# Patient Record
Sex: Male | Born: 1960 | Race: White | Hispanic: No | Smoking: Never smoker
Health system: Southern US, Community
[De-identification: ages and names within clinical notes are randomized; demographics above are authoritative.]

## PROBLEM LIST (undated history)

## (undated) DIAGNOSIS — K5792 Diverticulitis of intestine, part unspecified, without perforation or abscess without bleeding: Secondary | ICD-10-CM

## (undated) DIAGNOSIS — G4700 Insomnia, unspecified: Secondary | ICD-10-CM

## (undated) DIAGNOSIS — K644 Residual hemorrhoidal skin tags: Secondary | ICD-10-CM

## (undated) DIAGNOSIS — Z800 Family history of malignant neoplasm of digestive organs: Secondary | ICD-10-CM

## (undated) DIAGNOSIS — K219 Gastro-esophageal reflux disease without esophagitis: Secondary | ICD-10-CM

## (undated) DIAGNOSIS — Z86010 Personal history of colonic polyps: Secondary | ICD-10-CM

## (undated) DIAGNOSIS — N3010 Interstitial cystitis (chronic) without hematuria: Secondary | ICD-10-CM

## (undated) HISTORY — DX: Insomnia, unspecified: G47.00

## (undated) HISTORY — DX: Interstitial cystitis (chronic) without hematuria: N30.10

## (undated) HISTORY — PX: CYSTOSCOPY: SUR368

## (undated) HISTORY — DX: Residual hemorrhoidal skin tags: K64.4

## (undated) HISTORY — DX: Personal history of colonic polyps: Z86.010

## (undated) HISTORY — DX: Gastro-esophageal reflux disease without esophagitis: K21.9

## (undated) HISTORY — DX: Diverticulitis of intestine, part unspecified, without perforation or abscess without bleeding: K57.92

## (undated) HISTORY — DX: Family history of malignant neoplasm of digestive organs: Z80.0

## (undated) HISTORY — PX: COLONOSCOPY: SHX174

---

## 1998-11-05 ENCOUNTER — Encounter (INDEPENDENT_AMBULATORY_CARE_PROVIDER_SITE_OTHER): Payer: Self-pay | Admitting: Specialist

## 1998-11-05 ENCOUNTER — Ambulatory Visit (HOSPITAL_COMMUNITY): Admission: RE | Admit: 1998-11-05 | Discharge: 1998-11-05 | Payer: Self-pay | Admitting: Urology

## 1999-05-19 ENCOUNTER — Encounter (INDEPENDENT_AMBULATORY_CARE_PROVIDER_SITE_OTHER): Payer: Self-pay

## 1999-05-19 ENCOUNTER — Other Ambulatory Visit: Admission: RE | Admit: 1999-05-19 | Discharge: 1999-05-19 | Payer: Self-pay | Admitting: Gastroenterology

## 2001-06-11 ENCOUNTER — Emergency Department (HOSPITAL_COMMUNITY): Admission: EM | Admit: 2001-06-11 | Discharge: 2001-06-11 | Payer: Self-pay | Admitting: Emergency Medicine

## 2001-06-11 ENCOUNTER — Encounter: Payer: Self-pay | Admitting: Emergency Medicine

## 2001-08-02 ENCOUNTER — Emergency Department (HOSPITAL_COMMUNITY): Admission: EM | Admit: 2001-08-02 | Discharge: 2001-08-02 | Payer: Self-pay | Admitting: *Deleted

## 2004-02-18 ENCOUNTER — Ambulatory Visit: Payer: Self-pay | Admitting: Internal Medicine

## 2004-07-01 ENCOUNTER — Ambulatory Visit: Payer: Self-pay | Admitting: Internal Medicine

## 2004-07-09 ENCOUNTER — Ambulatory Visit: Payer: Self-pay | Admitting: Internal Medicine

## 2004-07-17 ENCOUNTER — Ambulatory Visit: Payer: Self-pay | Admitting: Internal Medicine

## 2004-07-24 ENCOUNTER — Ambulatory Visit: Payer: Self-pay | Admitting: Internal Medicine

## 2004-08-10 ENCOUNTER — Ambulatory Visit: Payer: Self-pay | Admitting: Internal Medicine

## 2005-01-04 ENCOUNTER — Ambulatory Visit: Payer: Self-pay | Admitting: Internal Medicine

## 2005-01-19 ENCOUNTER — Ambulatory Visit: Payer: Self-pay | Admitting: Internal Medicine

## 2005-01-22 ENCOUNTER — Ambulatory Visit: Payer: Self-pay | Admitting: Cardiovascular Disease

## 2005-01-22 ENCOUNTER — Emergency Department (HOSPITAL_COMMUNITY): Admission: EM | Admit: 2005-01-22 | Discharge: 2005-01-22 | Payer: Self-pay | Admitting: Emergency Medicine

## 2005-02-16 ENCOUNTER — Ambulatory Visit: Payer: Self-pay | Admitting: Gastroenterology

## 2005-03-01 ENCOUNTER — Encounter: Payer: Self-pay | Admitting: Internal Medicine

## 2005-03-01 ENCOUNTER — Ambulatory Visit: Payer: Self-pay | Admitting: Gastroenterology

## 2005-03-01 DIAGNOSIS — Z86010 Personal history of colonic polyps: Secondary | ICD-10-CM

## 2005-03-01 HISTORY — DX: Personal history of colonic polyps: Z86.010

## 2005-06-14 ENCOUNTER — Ambulatory Visit: Payer: Self-pay | Admitting: Internal Medicine

## 2005-08-23 ENCOUNTER — Ambulatory Visit: Payer: Self-pay | Admitting: Internal Medicine

## 2006-01-04 ENCOUNTER — Ambulatory Visit: Payer: Self-pay | Admitting: Internal Medicine

## 2006-06-03 DIAGNOSIS — N3010 Interstitial cystitis (chronic) without hematuria: Secondary | ICD-10-CM

## 2006-10-04 ENCOUNTER — Telehealth (INDEPENDENT_AMBULATORY_CARE_PROVIDER_SITE_OTHER): Payer: Self-pay | Admitting: *Deleted

## 2006-11-07 ENCOUNTER — Ambulatory Visit: Payer: Self-pay | Admitting: Internal Medicine

## 2006-11-22 ENCOUNTER — Ambulatory Visit: Payer: Self-pay | Admitting: Internal Medicine

## 2006-11-28 ENCOUNTER — Encounter (INDEPENDENT_AMBULATORY_CARE_PROVIDER_SITE_OTHER): Payer: Self-pay | Admitting: *Deleted

## 2006-11-28 LAB — CONVERTED CEMR LAB
ALT: 17 units/L (ref 0–53)
Basophils Absolute: 0 10*3/uL (ref 0.0–0.1)
Calcium: 9.4 mg/dL (ref 8.4–10.5)
Chloride: 103 meq/L (ref 96–112)
Creatinine, Ser: 1.2 mg/dL (ref 0.4–1.5)
Eosinophils Relative: 1.3 % (ref 0.0–5.0)
HCT: 44 % (ref 39.0–52.0)
LDL Cholesterol: 125 mg/dL — ABNORMAL HIGH (ref 0–99)
MCHC: 33.6 g/dL (ref 30.0–36.0)
Neutrophils Relative %: 77.5 % — ABNORMAL HIGH (ref 43.0–77.0)
Platelets: 157 10*3/uL (ref 150–400)
RBC: 4.92 M/uL (ref 4.22–5.81)
RDW: 12.6 % (ref 11.5–14.6)
Sodium: 138 meq/L (ref 135–145)
TSH: 4.32 microintl units/mL (ref 0.35–5.50)
Total CHOL/HDL Ratio: 5.1
Triglycerides: 48 mg/dL (ref 0–149)
WBC: 8.4 10*3/uL (ref 4.5–10.5)

## 2007-02-27 ENCOUNTER — Ambulatory Visit: Payer: Self-pay | Admitting: Internal Medicine

## 2007-04-13 ENCOUNTER — Telehealth (INDEPENDENT_AMBULATORY_CARE_PROVIDER_SITE_OTHER): Payer: Self-pay | Admitting: *Deleted

## 2007-04-14 ENCOUNTER — Ambulatory Visit: Payer: Self-pay | Admitting: Internal Medicine

## 2007-05-01 ENCOUNTER — Telehealth (INDEPENDENT_AMBULATORY_CARE_PROVIDER_SITE_OTHER): Payer: Self-pay | Admitting: *Deleted

## 2007-05-01 LAB — CONVERTED CEMR LAB: Herpes Simplex Vrs I&II-IgM Ab (EIA): NOT DETECTED

## 2007-08-15 ENCOUNTER — Ambulatory Visit: Payer: Self-pay | Admitting: Internal Medicine

## 2007-09-16 ENCOUNTER — Ambulatory Visit: Payer: Self-pay | Admitting: Family Medicine

## 2007-09-16 DIAGNOSIS — R51 Headache: Secondary | ICD-10-CM

## 2007-09-16 DIAGNOSIS — R519 Headache, unspecified: Secondary | ICD-10-CM | POA: Insufficient documentation

## 2007-11-13 ENCOUNTER — Ambulatory Visit: Payer: Self-pay | Admitting: Internal Medicine

## 2007-11-13 DIAGNOSIS — D126 Benign neoplasm of colon, unspecified: Secondary | ICD-10-CM

## 2007-11-13 DIAGNOSIS — G4700 Insomnia, unspecified: Secondary | ICD-10-CM

## 2007-11-15 ENCOUNTER — Ambulatory Visit: Payer: Self-pay | Admitting: Internal Medicine

## 2007-11-20 LAB — CONVERTED CEMR LAB
ALT: 30 units/L (ref 0–53)
AST: 26 units/L (ref 0–37)
CO2: 29 meq/L (ref 19–32)
Chloride: 111 meq/L (ref 96–112)
Creatinine, Ser: 1.2 mg/dL (ref 0.4–1.5)
GFR calc non Af Amer: 70 mL/min
Hemoglobin: 14.5 g/dL (ref 13.0–17.0)
LDL Cholesterol: 140 mg/dL — ABNORMAL HIGH (ref 0–99)
Potassium: 4.4 meq/L (ref 3.5–5.1)
Sodium: 141 meq/L (ref 135–145)
TSH: 4.32 microintl units/mL (ref 0.35–5.50)
Total CHOL/HDL Ratio: 5.9
Triglycerides: 70 mg/dL (ref 0–149)

## 2007-12-05 ENCOUNTER — Telehealth: Payer: Self-pay | Admitting: Internal Medicine

## 2008-01-15 ENCOUNTER — Ambulatory Visit: Payer: Self-pay | Admitting: Family Medicine

## 2008-01-15 LAB — CONVERTED CEMR LAB: Inflenza A Ag: NEGATIVE

## 2008-01-20 ENCOUNTER — Emergency Department (HOSPITAL_COMMUNITY): Admission: EM | Admit: 2008-01-20 | Discharge: 2008-01-21 | Payer: Self-pay | Admitting: Emergency Medicine

## 2008-02-12 ENCOUNTER — Ambulatory Visit: Payer: Self-pay | Admitting: Gastroenterology

## 2008-02-26 ENCOUNTER — Encounter: Payer: Self-pay | Admitting: Gastroenterology

## 2008-02-26 ENCOUNTER — Ambulatory Visit: Payer: Self-pay | Admitting: Gastroenterology

## 2008-02-27 ENCOUNTER — Encounter: Payer: Self-pay | Admitting: Gastroenterology

## 2008-03-15 ENCOUNTER — Ambulatory Visit: Payer: Self-pay | Admitting: Internal Medicine

## 2008-03-15 ENCOUNTER — Telehealth: Payer: Self-pay | Admitting: Gastroenterology

## 2008-03-15 DIAGNOSIS — K5732 Diverticulitis of large intestine without perforation or abscess without bleeding: Secondary | ICD-10-CM

## 2008-03-18 ENCOUNTER — Ambulatory Visit: Payer: Self-pay | Admitting: Gastroenterology

## 2008-03-18 ENCOUNTER — Inpatient Hospital Stay (HOSPITAL_COMMUNITY): Admission: EM | Admit: 2008-03-18 | Discharge: 2008-03-21 | Payer: Self-pay | Admitting: Emergency Medicine

## 2008-04-02 ENCOUNTER — Telehealth: Payer: Self-pay | Admitting: Nurse Practitioner

## 2008-04-08 ENCOUNTER — Ambulatory Visit: Payer: Self-pay | Admitting: Gastroenterology

## 2008-04-12 ENCOUNTER — Telehealth: Payer: Self-pay | Admitting: Nurse Practitioner

## 2008-06-06 ENCOUNTER — Ambulatory Visit: Payer: Self-pay | Admitting: Family Medicine

## 2008-06-06 LAB — CONVERTED CEMR LAB
Nitrite: NEGATIVE
Protein, U semiquant: NEGATIVE
Specific Gravity, Urine: 1.015
Urobilinogen, UA: 0.2
pH: 7.5

## 2008-06-07 ENCOUNTER — Encounter: Payer: Self-pay | Admitting: Family Medicine

## 2008-06-07 ENCOUNTER — Telehealth: Payer: Self-pay | Admitting: Family Medicine

## 2008-06-07 LAB — CONVERTED CEMR LAB: Bacteria, UA: NONE SEEN

## 2008-06-08 ENCOUNTER — Encounter: Payer: Self-pay | Admitting: Family Medicine

## 2008-06-10 ENCOUNTER — Encounter (INDEPENDENT_AMBULATORY_CARE_PROVIDER_SITE_OTHER): Payer: Self-pay | Admitting: *Deleted

## 2008-06-18 ENCOUNTER — Encounter: Payer: Self-pay | Admitting: Internal Medicine

## 2008-06-20 ENCOUNTER — Encounter: Payer: Self-pay | Admitting: Internal Medicine

## 2008-10-24 ENCOUNTER — Telehealth: Payer: Self-pay | Admitting: Gastroenterology

## 2008-10-24 ENCOUNTER — Emergency Department (HOSPITAL_COMMUNITY): Admission: EM | Admit: 2008-10-24 | Discharge: 2008-10-25 | Payer: Self-pay | Admitting: Emergency Medicine

## 2008-10-29 ENCOUNTER — Telehealth: Payer: Self-pay | Admitting: Gastroenterology

## 2008-11-14 ENCOUNTER — Ambulatory Visit: Payer: Self-pay | Admitting: Gastroenterology

## 2008-12-18 ENCOUNTER — Encounter: Payer: Self-pay | Admitting: Gastroenterology

## 2009-03-26 ENCOUNTER — Ambulatory Visit: Payer: Self-pay | Admitting: Internal Medicine

## 2009-03-27 LAB — CONVERTED CEMR LAB
ALT: 26 units/L (ref 0–53)
AST: 23 units/L (ref 0–37)
BUN: 11 mg/dL (ref 6–23)
Basophils Relative: 0.2 % (ref 0.0–3.0)
CO2: 29 meq/L (ref 19–32)
Chloride: 106 meq/L (ref 96–112)
Creatinine, Ser: 1 mg/dL (ref 0.4–1.5)
Eosinophils Absolute: 0.1 10*3/uL (ref 0.0–0.7)
Eosinophils Relative: 3.4 % (ref 0.0–5.0)
HCT: 42.3 % (ref 39.0–52.0)
LDL Cholesterol: 111 mg/dL — ABNORMAL HIGH (ref 0–99)
Lymphs Abs: 0.8 10*3/uL (ref 0.7–4.0)
MCHC: 33.1 g/dL (ref 30.0–36.0)
MCV: 90.7 fL (ref 78.0–100.0)
Monocytes Absolute: 0.4 10*3/uL (ref 0.1–1.0)
Neutrophils Relative %: 63.9 % (ref 43.0–77.0)
PSA: 1.05 ng/mL (ref 0.10–4.00)
Platelets: 137 10*3/uL — ABNORMAL LOW (ref 150.0–400.0)
Potassium: 3.8 meq/L (ref 3.5–5.1)
RBC: 4.67 M/uL (ref 4.22–5.81)
VLDL: 16.2 mg/dL (ref 0.0–40.0)
WBC: 3.8 10*3/uL — ABNORMAL LOW (ref 4.5–10.5)

## 2009-04-02 ENCOUNTER — Ambulatory Visit: Payer: Self-pay | Admitting: Internal Medicine

## 2009-04-15 ENCOUNTER — Telehealth: Payer: Self-pay | Admitting: Internal Medicine

## 2009-06-23 ENCOUNTER — Ambulatory Visit: Payer: Self-pay | Admitting: Internal Medicine

## 2009-08-05 ENCOUNTER — Emergency Department (HOSPITAL_COMMUNITY): Admission: EM | Admit: 2009-08-05 | Discharge: 2009-08-06 | Payer: Self-pay | Admitting: Emergency Medicine

## 2009-08-22 ENCOUNTER — Ambulatory Visit: Payer: Self-pay | Admitting: Internal Medicine

## 2009-08-26 ENCOUNTER — Telehealth: Payer: Self-pay | Admitting: Internal Medicine

## 2009-09-04 ENCOUNTER — Ambulatory Visit: Payer: Self-pay | Admitting: Internal Medicine

## 2010-01-29 ENCOUNTER — Telehealth: Payer: Self-pay | Admitting: Gastroenterology

## 2010-02-24 ENCOUNTER — Ambulatory Visit
Admission: RE | Admit: 2010-02-24 | Discharge: 2010-02-24 | Payer: Self-pay | Source: Home / Self Care | Attending: Internal Medicine | Admitting: Internal Medicine

## 2010-02-24 DIAGNOSIS — K219 Gastro-esophageal reflux disease without esophagitis: Secondary | ICD-10-CM | POA: Insufficient documentation

## 2010-03-24 NOTE — Assessment & Plan Note (Signed)
Summary: congested/headache/cbs   Vital Signs:  Patient profile:   50 year old male Weight:      166.50 pounds Temp:     97.7 degrees F oral Pulse rate:   84 / minute Pulse rhythm:   regular BP sitting:   118 / 84  (left arm) Cuff size:   large  Vitals Entered By: Army Fossa CMA (September 04, 2009 8:25 AM) CC: c/o after finishing ATB symtoms returned. Comments - Having head congestion, HA, chest congestion.   History of Present Illness: was seen 2 weeks ago with a URI, as he was not feeling better he was Rx and took amoxicillin, he finished antibiotics 2 days ago and felt  great. Yesterday many of the symptoms came back --- headache, fatigue, chest congestion.  ROS No fever Some cough at present No sputum in the last couple of days No nausea vomiting Achy all over yesterday  Allergies: 1)  ! Demerol  Past History:  Past Medical History: Reviewed history from 04/02/2009 and no changes required. COLONIC POLYPS (ICD-211.3) INSOMNIA-SLEEP DISORDER-UNSPEC (ICD-780.52) Hx of INTERSTITIAL CYSTITIS   h/o diverticulitis   Past Surgical History: Reviewed history from 04/02/2009 and no changes required. Denies surgical history    Social History: Reviewed history from 04/02/2009 and no changes required. 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  Alcohol Use - no diet-- exercise--  runs 10 miles a week, wt lifting almost daily   Physical Exam  General:  alert, well-developed, and well-nourished.  nontoxic Head:  face symmetric, not tender to palpation Ears:  R ear normal and L ear normal.   Nose:  slightly congested Mouth:  no redness or discharge Lungs:  normal respiratory effort, no intercostal retractions, no accessory muscle use, and normal breath sounds.   Heart:  normal rate, regular rhythm, and no murmur.     Impression & Recommendations:  Problem # 1:  VIRAL INFECTION (ICD-079.99) URI type of  symptoms improved w/  amoxicillin Symptoms came back 2 days ago New virus? Atypical infection-bronchitis ? Plan----->  see instructions  Complete Medication List: 1)  Zithromax Z-pak 250 Mg Tabs (Azithromycin) .... As directed 2)  Prednisone 20 Mg Tabs (Prednisone) .... One by mouth daily for 5 days  Patient Instructions: 1)  rest, fluids 2)  Tylenol or Advil as needed for aches and pains or low grade temp 3)  Mucinex DM twice a day for one week 4)  Sudafed 30 mg behind the counter, one every 6 hours as needed for congestion 5)  if in 3 days you're not better,start a Z-Pak and a prednisone for 5 days 6)  Call anytime if you feel worse Prescriptions: PREDNISONE 20 MG TABS (PREDNISONE) one by mouth daily for 5 days  #5 x 0   Entered and Authorized by:   Nolon Rod. Cavion Faiola MD   Signed by:   Nolon Rod. Aliannah Holstrom MD on 09/04/2009   Method used:   Print then Give to Patient   RxID:   [id number] ZITHROMAX Z-PAK 250 MG TABS (AZITHROMYCIN) as directed  #1 x 0   Entered and Authorized by:   Nolon Rod. Fenris Cauble MD   Signed by:   Nolon Rod. Katherina Wimer MD on 09/04/2009   Method used:   Print then Give to Patient   RxID:   [id number]

## 2010-03-24 NOTE — Progress Notes (Signed)
Summary: Medication refill  Phone Note Call from Patient Call back at Home Phone [phone]   Caller: Patient Call For: Dr. Jarold Motto Reason for Call: Refill Medication Summary of Call: Needs a refill on his Hyoscyamine...[facility] [address] Initial call taken by: Karna Christmas,  January 29, 2010 8:47 AM  Follow-up for Phone Call        rx sent, pt aware Follow-up by: Harlow Mares CMA Duncan Dull),  January 29, 2010 8:56 AM    New/Updated Medications: LEVSIN 0.125 MG TABS (HYOSCYAMINE SULFATE) take one by mouth three times a day as needed Prescriptions: LEVSIN 0.125 MG TABS (HYOSCYAMINE SULFATE) take one by mouth three times a day as needed  #90 x 1   Entered by:   Harlow Mares CMA (AAMA)   Authorized by:   Mardella Layman MD [facility]   Signed by:   Harlow Mares CMA (AAMA) on 01/29/2010   Method used:   Electronically to        [facility] [phone]* (retail)       [address]       [place], [place]  [address]       Ph: [phone]       Fax: [phone]   RxID:   [id number]

## 2010-03-24 NOTE — Assessment & Plan Note (Signed)
Summary: dime size rash on leg//lch   Vital Signs:  Patient profile:   50 year old male Height:      68.25 inches Weight:      168.8 pounds Temp:     98.8 degrees F BP sitting:   100 / 60  Vitals Entered By: Shary Decamp (Jun 23, 2009 9:39 AM) CC: red area on rt leg x 3 days   History of Present Illness: -- red area on rt leg x 3 days --also reports he  self discontinued Ambien, does not like to take medication. he is sleeping  4 hours only but he wakes up refreshed and ready to go .  Wonders if that is a problem  Current Medications (verified): 1)  None  Allergies (verified): 1)  ! Demerol  Past History:  Past Medical History: Reviewed history from 04/02/2009 and no changes required. COLONIC POLYPS (ICD-211.3) INSOMNIA-SLEEP DISORDER-UNSPEC (ICD-780.52) Hx of INTERSTITIAL CYSTITIS   h/o diverticulitis   Past Surgical History: Reviewed history from 04/02/2009 and no changes required. Denies surgical history    Review of Systems       denies any injury to the leg, rash is not itching or hurting; no exposure to poison ivy no anxiety or depression at present  Physical Exam  General:  alert, well-developed, and well-nourished.   Extremities:  left leg normal right leg: Inner aspect of the mid pretibial area has a 2 inches area of mild swelling and ecchymoses, the midle  is very red.  No vesicles   Impression & Recommendations:  Problem # 1:  RASH-NONVESICULAR (ICD-782.1) likely a hematoma.  Recommend ice for few days, call if not back to normal in 3  weeks  Problem # 2:  INSOMNIA-SLEEP DISORDER-UNSPEC (ICD-780.52) if he sleeps 4 hours and wakes up refreshed and rested, I think is okay  but at the same time he should aim to 6 hours of sleep every night. printed material provided regards good sleeping habits  The following medications were removed from the medication list:    Zolpidem Tartrate 10 Mg Tabs (Zolpidem tartrate) .Marland Kitchen... 1/2 by mouth qhs

## 2010-03-24 NOTE — Assessment & Plan Note (Signed)
Summary: cpx//ccm   Vital Signs:  Patient profile:   50 year old male Height:      68.25 inches Weight:      169 pounds BMI:     25.60 Pulse rate:   60 / minute BP sitting:   100 / 60  Vitals Entered By: Shary Decamp (April 02, 2009 2:04 PM)   History of Present Illness: CPX  Allergies: 1)  ! Demerol  Past History:  Past Medical History: COLONIC POLYPS (ICD-211.3) INSOMNIA-SLEEP DISORDER-UNSPEC (ICD-780.52) Hx of INTERSTITIAL CYSTITIS   h/o diverticulitis   Past Surgical History: Denies surgical history    Family History: Reviewed history from 03/15/2008 and no changes required. father is doing well hypertension--HTN diabetes--no heart attacks--no. 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  Alcohol Use - no diet-- exercise--  runs 10 miles a week, wt lifting almost daily   Review of Systems General:  Denies fever and weight loss. CV:  Denies chest pain or discomfort, palpitations, and swelling of feet. Resp:  Denies cough, coughing up blood, and wheezing. GU:  Denies hematuria, urinary frequency, and urinary hesitancy. Psych:  Denies anxiety and depression; sleeps aprox 5 h at night , has 2 jobs .  Physical Exam  General:  alert, well-developed, and well-nourished.   Neck:  no masses, no thyromegaly, no HJR, and normal carotid upstroke.   Lungs:  normal respiratory effort, no intercostal retractions, no accessory muscle use, and normal breath sounds.   Heart:  normal rate, regular rhythm, no murmur, and no gallop.   Abdomen:  soft, non-tender, no distention, no masses, no guarding, and no rigidity.   Extremities:  no pretibial edema bilaterally  Psych:  Cognition and judgment appear intact. Alert and cooperative with normal attention span and concentration    Impression &  Recommendations:  Problem # 1:  HEALTH SCREENING (ICD-V70.0) T 08 got a flu shot  next colonoscopy per GI rec.  to continue with his healthy lifestyle . multiple questions about screening for cancer  answered all labs  reviewed, mild thrombocytopenia, repat CBCs yearly   Complete Medication List: 1)  Levsin 0.125 Mg Tabs (Hyoscyamine sulfate) .... Take as needed  Patient Instructions: 1)  Please schedule a follow-up appointment in 1 year.    Preventive Care Screening  Prior Values:    PSA:  1.05 (03/26/2009)    Colonoscopy:  Location:  [facility].   (02/26/2008)    Last Tetanus Booster:  Tdap (11/07/2006)    Risk Factors: Tobacco use:  never Alcohol use:  no Exercise:  yes    Times per week:  7    Type:  RUNNING,WT LIFTING  Colonoscopy History:    Date of Last Colonoscopy:  02/26/2008

## 2010-03-24 NOTE — Progress Notes (Signed)
Summary: ?xanax  Phone Note Call from Patient Call back at [facility] Phone [phone]   Summary of Call: Patient having trouble sleeping.  Working 2 jobs & goes to bed @ 1am, gets up @ 7am.. states he is a light sleeper so sometimes he wakes up around 5am.  Patient has some xanax 0.25 from a neighbor.  Worked well.  requesting rx.  patient states that he has taken [place] before but made him feel "drugged" the next day. RA Sharin Mons RD Shary Decamp  April 15, 2009 2:04 PM   Follow-up for Phone Call        needs to try ambien 10mg  1/2 tab at night #30, no RF Mandie Crabbe E. Orvil Faraone MD  April 15, 2009 5:06 PM   discussed with pt Shary Decamp  April 16, 2009 9:04 AM     New/Updated Medications: ZOLPIDEM TARTRATE 10 MG TABS (ZOLPIDEM TARTRATE) 1/2 by mouth qhs Prescriptions: ZOLPIDEM TARTRATE 10 MG TABS (ZOLPIDEM TARTRATE) 1/2 by mouth qhs  #30 x 0   Entered by:   Shary Decamp   Authorized by:   Nolon Rod. Asheton Scheffler MD   Signed by:   Shary Decamp on 04/16/2009   Method used:   Printed then faxed to ...       [facility]  [address] [phone]* (retail)       [address]       [place], [place]  [address]       Ph: [phone]       Fax: [phone]   RxID:   [id number]

## 2010-03-24 NOTE — Progress Notes (Signed)
Summary: Cold Symptoms  Phone Note Call from Patient Call back at Home Phone [phone]   Caller: Patient Reason for Call: Talk to Doctor Summary of Call: Patient called and left a message on the triage line stating Dr. Drue Novel told him to call back on tuesday if he was not feeling any better. He said he was still having bad cold symptoms.  Please advise.  Initial call taken by: Harold Barban,  August 26, 2009 9:16 AM  Follow-up for Phone Call        call amoxicillin 500 mg 2 by mouth two times a day x 1 week #28, no Rf Angello Chien E. Laysa Kimmey MD  August 26, 2009 12:55 PM   Additional Follow-up for Phone Call Additional follow up Details #1::        Pt is aware. Army Fossa CMA  August 26, 2009 1:32 PM     New/Updated Medications: AMOXICILLIN 500 MG CAPS (AMOXICILLIN) 2 by mouth two times a day x 1 week Prescriptions: AMOXICILLIN 500 MG CAPS (AMOXICILLIN) 2 by mouth two times a day x 1 week  #28 x 0   Entered by:   Army Fossa CMA   Authorized by:   Nolon Rod. Yeily Link MD   Signed by:   Army Fossa CMA on 08/26/2009   Method used:   Electronically to        [facility] [phone]* (retail)       [address]       [place], [place]  [address]       Ph: [phone]       Fax: [phone]   RxID:   [id number]

## 2010-03-24 NOTE — Assessment & Plan Note (Signed)
Summary: congested/sore throat//kn   Vital Signs:  Patient profile:   50 year old male Weight:      169.13 pounds Temp:     97.0 degrees F oral Pulse rate:   80 / minute Pulse rhythm:   regular BP sitting:   124 / 80  (left arm) Cuff size:   large  Vitals Entered By: Army Fossa CMA (August 22, 2009 11:09 AM) CC: c/o head cold Comments - Drainage in throat Minor cough x 2 days    History of Present Illness: sore throat for 2 days, then developed chest congestion, some cough, coughing clear sputum  ROS no sick contacts  (-) fever (-) N-V-D mild myalgias all over    Allergies: 1)  ! Demerol  Past History:  Past Medical History: Reviewed history from 04/02/2009 and no changes required. COLONIC POLYPS (ICD-211.3) INSOMNIA-SLEEP DISORDER-UNSPEC (ICD-780.52) Hx of INTERSTITIAL CYSTITIS   h/o diverticulitis   Past Surgical History: Reviewed history from 04/02/2009 and no changes required. Denies surgical history    Social History: Reviewed history from 04/02/2009 and no changes required. 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  Alcohol Use - no diet-- exercise--  runs 10 miles a week, wt lifting almost daily   Review of Systems      See HPI  Physical Exam  General:  alert, well-developed, and well-nourished.   Ears:  R ear normal and L ear normal.   Mouth:  no redness, no discharge Neck:  no masses and no cervical lymphadenopathy.   Lungs:  normal respiratory effort, no intercostal retractions, no accessory muscle use, and normal breath sounds.   Heart:  normal rate, regular rhythm, no murmur, and no gallop.     Impression & Recommendations:  Problem # 1:  URI (ICD-465.9) Assessment New see instructions  Patient Instructions: 1)  rest, fluids, Tylenol or Advil if you develop fever 2)  Robitussin-DM over-the-counter as needed for cough 3)  Call in 3 to 4 days if not better 4)  Call  anytime if you feel worse or you have high fever

## 2010-03-26 NOTE — Assessment & Plan Note (Signed)
Summary: DISCUSS SEVERE HEARTBURN CAUSED BY MEDICATION FROM UROLOGIST/...   Vital Signs:  Patient profile:   50 year old male Height:      68.25 inches Weight:      167.38 pounds BMI:     25.36 Pulse rate:   80 / minute Pulse rhythm:   regular BP sitting:   128 / 86  (left arm) Cuff size:   large  Vitals Entered By: Army Fossa CMA (February 24, 2010 10:15 AM) CC: Pt here to discuss heartburn x 1 month Comments Only took 2 pills of new med urologist put him on- gave him Amitriptyline [facility] [address]     History of Present Illness: a month ago, he tried amitriptyline x 2 days as prescribed as his urologist for overactive bladder. After the first few, he developed "indigestion", "chest pain"; after the second dose he decided to stop amitriptyline.  The indigestion is described as an  epigastric rumbling and discomfort, he had pain in the anterior chest described as "soreness" with no radiation or associated symptoms except belching. He went running while he was having all these  sx and felt normal. he is taking a Nexium daily from a friend, it completely relieves the symptoms. He forgot to take it one day and have mild symptoms.  Review of systems Denies nausea or vomiting No dysphagia or odynophagia No shortness of breath or lower extremity edema  Current Medications (verified): 1)  Levsin 0.125 Mg Tabs (Hyoscyamine Sulfate) .... Take One By Mouth Three Times A Day As Needed  Allergies (verified): 1)  ! Demerol  Past History:  Past Medical History: COLONIC POLYPS (ICD-211.3) INSOMNIA-SLEEP DISORDER-UNSPEC (ICD-780.52) Hx of INTERSTITIAL CYSTITIS   h/o diverticulitis  GERD  Social History: Reviewed history from 04/02/2009 and no changes required. 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  Alcohol Use - no diet-- exercise--  runs 10 miles a week, wt lifting almost daily   Physical Exam  General:   alert, well-developed, and well-nourished.   Chest Wall:  no deformities and no tenderness.   Lungs:  normal respiratory effort, no intercostal retractions, no accessory muscle use, and normal breath sounds.   Heart:  normal rate, regular rhythm, and no murmur.   Abdomen:  soft, non-tender, no distention, no masses, no guarding, and no rigidity.     Impression & Recommendations:  Problem # 1:  GERD (ICD-530.81) developed GI symptoms and chest pain shortly after 2 doses of amitriptyline. Symptoms still persist despite the fact that he has not taken amitriptyline in almost a month. I doubt he has a cardiac problem, likely symptoms are related to GERD Plan: see instructions  His updated medication list for this problem includes:    Levsin 0.125 Mg Tabs (Hyoscyamine sulfate) .Marland Kitchen... Take one by mouth three times a day as needed    Nexium 40 Mg Cpdr (Esomeprazole magnesium) .Marland Kitchen... 1 by mouth once daily on a empty stomach  Complete Medication List: 1)  Levsin 0.125 Mg Tabs (Hyoscyamine sulfate) .... Take one by mouth three times a day as needed 2)  Nexium 40 Mg Cpdr (Esomeprazole magnesium) .Marland Kitchen.. 1 by mouth once daily on a empty stomach  Patient Instructions: 1)  read the information about GERD 2)  nexium x 6 weeks daily, then as needed  3)  if symptoms severe or persist: let me know  Prescriptions: NEXIUM 40 MG CPDR (ESOMEPRAZOLE MAGNESIUM) 1 by mouth once daily on a empty  stomach  #90 x 1   Entered and Authorized by:   Nolon Rod. Patsy Zaragoza MD   Signed by:   Nolon Rod. Yandel Zeiner MD on 02/24/2010   Method used:   Print then Give to Patient   RxID:   [id number]    Orders Added: 1)  Est. Patient Level III [14782]

## 2010-04-02 ENCOUNTER — Encounter: Payer: Self-pay | Admitting: Internal Medicine

## 2010-04-02 ENCOUNTER — Encounter (INDEPENDENT_AMBULATORY_CARE_PROVIDER_SITE_OTHER): Payer: BC Managed Care – PPO | Admitting: Internal Medicine

## 2010-04-02 ENCOUNTER — Other Ambulatory Visit: Payer: Self-pay | Admitting: Internal Medicine

## 2010-04-02 DIAGNOSIS — Z0000 Encounter for general adult medical examination without abnormal findings: Secondary | ICD-10-CM

## 2010-04-02 DIAGNOSIS — K219 Gastro-esophageal reflux disease without esophagitis: Secondary | ICD-10-CM

## 2010-04-02 DIAGNOSIS — K5732 Diverticulitis of large intestine without perforation or abscess without bleeding: Secondary | ICD-10-CM

## 2010-04-02 LAB — HEPATIC FUNCTION PANEL
Albumin: 4.1 g/dL (ref 3.5–5.2)
Alkaline Phosphatase: 52 U/L (ref 39–117)
Total Protein: 7 g/dL (ref 6.0–8.3)

## 2010-04-02 LAB — CBC WITH DIFFERENTIAL/PLATELET
Eosinophils Relative: 2.7 % (ref 0.0–5.0)
Lymphocytes Relative: 15.5 % (ref 12.0–46.0)
Monocytes Absolute: 0.5 10*3/uL (ref 0.1–1.0)
Monocytes Relative: 11.1 % (ref 3.0–12.0)
Neutrophils Relative %: 70.4 % (ref 43.0–77.0)
Platelets: 117 10*3/uL — ABNORMAL LOW (ref 150.0–400.0)
RBC: 4.67 Mil/uL (ref 4.22–5.81)
WBC: 4.7 10*3/uL (ref 4.5–10.5)

## 2010-04-02 LAB — LIPID PANEL
Cholesterol: 182 mg/dL (ref 0–200)
HDL: 32.8 mg/dL — ABNORMAL LOW (ref >39.00)
LDL Cholesterol: 124 mg/dL — ABNORMAL HIGH (ref 0–99)
Triglycerides: 126 mg/dL (ref 0.0–149.0)
VLDL: 25.2 mg/dL (ref 0.0–40.0)

## 2010-04-02 LAB — BASIC METABOLIC PANEL
BUN: 17 mg/dL (ref 6–23)
Calcium: 8.8 mg/dL (ref 8.4–10.5)
Creatinine, Ser: 1.1 mg/dL (ref 0.4–1.5)
GFR: 78.37 mL/min (ref >60.00)

## 2010-04-02 LAB — CONVERTED CEMR LAB
Chlamydia, Swab/Urine, PCR: NEGATIVE
HIV: NONREACTIVE

## 2010-04-09 NOTE — Assessment & Plan Note (Signed)
Summary: YEARLY//PH   Vital Signs:  Patient profile:   50 year old male Height:      68.25 inches Weight:      168.25 pounds Pulse rate:   73 / minute Pulse rhythm:   regular BP sitting:   130 / 76  (left arm) Cuff size:   large  Vitals Entered By: Army Fossa CMA (April 02, 2010 8:08 AM) CC: CPX, fasting  Comments no complaints  wants to be checked for STD's today [facility] [address]    History of Present Illness: CPX likes "all  STDs checked" Denies high-risk sexual intercourse, no symptoms  Current Medications (verified): 1)  None  Allergies (verified): 1)  ! Demerol  Past History:  Past Medical History: COLONIC POLYPS   INSOMNIA-SLEEP DISORDER-UNSPEC  Hx of INTERSTITIAL CYSTITIS   h/o diverticulitis  GERD  Past Surgical History: Reviewed history from 04/02/2009 and no changes required. Denies surgical history    Family History: father is doing well, 64 y/o hypertension-- M diabetes--no heart attacks--no. 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  Alcohol Use - no diet-- exercise--  runs 10 miles a week, wt lifting almost daily   Review of Systems CV:  Denies chest pain or discomfort and swelling of feet. Resp:  Denies cough, shortness of breath, and sputum productive. GI:  Denies bloody stools, diarrhea, nausea, and vomiting; had GERD , symptoms subsided after meds, currently on no meds . GU:  continue with mild urinary symptoms from interstitial cystitis. Specifically he denies penile discharge, genital rash.Marland Kitchen Psych:  Denies anxiety and depression.  Physical Exam  General:  alert, well-developed, and well-nourished.   Neck:  no masses and no thyromegaly.   Lungs:  normal respiratory effort, no intercostal retractions, no accessory muscle use, and normal breath  sounds.   Heart:  normal rate, regular rhythm, and no murmur.   Abdomen:  soft, non-tender, no distention, no masses, no guarding, and no rigidity.   Extremities:  no pretibial edema bilaterally  Psych:  Cognition and judgment appear intact. Alert and cooperative with normal attention span and concentration. not anxious appearing and not depressed appearing.     Impression & Recommendations:  Problem # 1:  HEALTH SCREENING (ICD-V70.0)  Td  08 declines a flu shot   last colonoscopy 2010, polyp, next in 5 years  rec.  to continue with his healthy lifestyl  in addition to his regular blood work will do an HIV, VDRL and G&C per patient request. EKG @ baseline  Orders: Venipuncture (98119) TLB-BMP (Basic Metabolic Panel-BMET) (80048-METABOL) TLB-CBC Platelet - w/Differential (85025-CBCD) TLB-Lipid Panel (80061-LIPID) TLB-Hepatic/Liver Function Pnl (80076-HEPATIC) T-HIV Antibody  (Reflex) ([id number]) T-RPR (Syphilis) (30865-78469) Specimen Handling (62952) EKG w/ Interpretation (93000)  Patient Instructions: 1)  Please schedule a follow-up appointment in 1 year.    Orders Added: 1)  Venipuncture [36415] 2)  TLB-BMP (Basic Metabolic Panel-BMET) [80048-METABOL] 3)  TLB-CBC Platelet - w/Differential [85025-CBCD] 4)  TLB-Lipid Panel [80061-LIPID] 5)  TLB-Hepatic/Liver Function Pnl [80076-HEPATIC] 6)  T-HIV Antibody  (Reflex) [[id number]] 7)  T-RPR (Syphilis) [27253-66440] 8)  Specimen Handling [99000] 9)  EKG w/ Interpretation [93000] 10)  Est. Patient age 15-64 [phone]

## 2010-05-11 LAB — BASIC METABOLIC PANEL
GFR calc non Af Amer: >60 mL/min (ref >60)
Glucose, Bld: 106 mg/dL — ABNORMAL HIGH (ref 70–99)
Potassium: 3.7 mEq/L (ref 3.5–5.1)
Sodium: 137 mEq/L (ref 135–145)

## 2010-05-11 LAB — DIFFERENTIAL
Eosinophils Relative: 5 % (ref 0–5)
Lymphocytes Relative: 23 % (ref 12–46)
Lymphs Abs: 1.1 10*3/uL (ref 0.7–4.0)
Monocytes Absolute: 0.7 10*3/uL (ref 0.1–1.0)

## 2010-05-11 LAB — CBC
HCT: 42.2 % (ref 39.0–52.0)
Hemoglobin: 14.1 g/dL (ref 13.0–17.0)
WBC: 4.8 10*3/uL (ref 4.0–10.5)

## 2010-05-11 LAB — POCT CARDIAC MARKERS: CKMB, poc: <1 ng/mL — ABNORMAL LOW (ref 1.0–8.0)

## 2010-05-29 LAB — COMPREHENSIVE METABOLIC PANEL
ALT: 32 U/L (ref 0–53)
AST: 29 U/L (ref 0–37)
Albumin: 3.8 g/dL (ref 3.5–5.2)
Alkaline Phosphatase: 57 U/L (ref 39–117)
BUN: 14 mg/dL (ref 6–23)
CO2: 25 mEq/L (ref 19–32)
Calcium: 8.7 mg/dL (ref 8.4–10.5)
Chloride: 107 mEq/L (ref 96–112)
Creatinine, Ser: 0.9 mg/dL (ref 0.4–1.5)
GFR calc Af Amer: >60 mL/min (ref >60)
GFR calc non Af Amer: >60 mL/min (ref >60)
Glucose, Bld: 103 mg/dL — ABNORMAL HIGH (ref 70–99)
Potassium: 3.6 mEq/L (ref 3.5–5.1)
Sodium: 137 mEq/L (ref 135–145)
Total Bilirubin: 0.5 mg/dL (ref 0.3–1.2)
Total Protein: 7 g/dL (ref 6.0–8.3)

## 2010-05-29 LAB — URINALYSIS, ROUTINE W REFLEX MICROSCOPIC
Glucose, UA: NEGATIVE mg/dL
Specific Gravity, Urine: 1.008 (ref 1.005–1.030)
pH: 6.5 (ref 5.0–8.0)

## 2010-05-29 LAB — CBC
HCT: 41.4 % (ref 39.0–52.0)
Hemoglobin: 13.9 g/dL (ref 13.0–17.0)
Platelets: 136 10*3/uL — ABNORMAL LOW (ref 150–400)
WBC: 10.3 10*3/uL (ref 4.0–10.5)

## 2010-05-29 LAB — DIFFERENTIAL
Eosinophils Absolute: 0.1 10*3/uL (ref 0.0–0.7)
Eosinophils Relative: 1 % (ref 0–5)
Lymphs Abs: 0.9 10*3/uL (ref 0.7–4.0)
Monocytes Relative: 12 % (ref 3–12)

## 2010-06-08 LAB — CBC
HCT: 37.1 % — ABNORMAL LOW (ref 39.0–52.0)
HCT: 42.4 % (ref 39.0–52.0)
Hemoglobin: 12.6 g/dL — ABNORMAL LOW (ref 13.0–17.0)
Hemoglobin: 14.1 g/dL (ref 13.0–17.0)
MCHC: 34.1 g/dL (ref 30.0–36.0)
MCV: 90.5 fL (ref 78.0–100.0)
Platelets: 153 10*3/uL (ref 150–400)
RBC: 4.16 MIL/uL — ABNORMAL LOW (ref 4.22–5.81)
RDW: 13.1 % (ref 11.5–15.5)
RDW: 13.3 % (ref 11.5–15.5)
WBC: 11.2 10*3/uL — ABNORMAL HIGH (ref 4.0–10.5)

## 2010-06-08 LAB — DIFFERENTIAL
Basophils Absolute: 0 10*3/uL (ref 0.0–0.1)
Basophils Relative: 0 % (ref 0–1)
Lymphocytes Relative: 5 % — ABNORMAL LOW (ref 12–46)
Monocytes Absolute: 1.1 10*3/uL — ABNORMAL HIGH (ref 0.1–1.0)
Neutro Abs: 9.5 10*3/uL — ABNORMAL HIGH (ref 1.7–7.7)
Neutrophils Relative %: 85 % — ABNORMAL HIGH (ref 43–77)

## 2010-06-08 LAB — BASIC METABOLIC PANEL
CO2: 27 mEq/L (ref 19–32)
Chloride: 105 mEq/L (ref 96–112)
GFR calc Af Amer: >60 mL/min (ref >60)
Glucose, Bld: 81 mg/dL (ref 70–99)
Potassium: 4.2 mEq/L (ref 3.5–5.1)
Sodium: 138 mEq/L (ref 135–145)

## 2010-06-08 LAB — COMPREHENSIVE METABOLIC PANEL
Albumin: 3.7 g/dL (ref 3.5–5.2)
BUN: 12 mg/dL (ref 6–23)
Chloride: 101 mEq/L (ref 96–112)
Creatinine, Ser: 0.88 mg/dL (ref 0.4–1.5)
GFR calc non Af Amer: >60 mL/min (ref >60)
Glucose, Bld: 100 mg/dL — ABNORMAL HIGH (ref 70–99)
Total Bilirubin: 0.7 mg/dL (ref 0.3–1.2)

## 2010-06-08 LAB — CULTURE, BLOOD (ROUTINE X 2): Culture: NO GROWTH

## 2010-06-08 LAB — URINALYSIS, ROUTINE W REFLEX MICROSCOPIC
Glucose, UA: NEGATIVE mg/dL
Hgb urine dipstick: NEGATIVE
Ketones, ur: NEGATIVE mg/dL
Protein, ur: NEGATIVE mg/dL

## 2010-06-08 LAB — LIPASE, BLOOD: Lipase: 32 U/L (ref 11–59)

## 2010-07-07 NOTE — H&P (Signed)
NAMEMARCO, Timothy Medina NO.:  [id number]   MEDICAL RECORD NO.:  [id number]          PATIENT TYPE:  INP   LOCATION:  0106                         FACILITY:  [facility]   PHYSICIAN:  Barbette Hair. Arlyce Dice, MD,FACGDATE OF BIRTH:  [birth year]1962   DATE OF ADMISSION:  03/18/2008  DATE OF DISCHARGE:                              HISTORY & PHYSICAL   PRIMARY GASTROENTEROLOGIST:  Vania Rea. Jarold Motto, MD, Clementeen Graham, FACP, FAGA.   PRIMARY PHYSICIAN:  Willow Ora, MD.   HISTORY OF PRESENT ILLNESS:  Timothy Medina is a 50 year old male with a  history of recurrent diverticulitis in 2006 and again in November 2009.  The patient was seen in our office Friday with abdominal pain similar to  diverticular pain.  He was restarted on Cipro and Flagyl.  The patient  had been in the emergency department the end of November for  diverticulitis.  CAT scan at that time was consistent with sigmoid  diverticulitis.  The patient was given Cipro and Flagyl and took all but  just a couple of doses.  The patient was fine for several weeks until a  few days ago his left lower quadrant abdominal pain returned and he came  into our office to be seen.  At the time of our office visit the patient  had already started himself on Cipro and Flagyl.  He was not having any  nausea or vomiting.  As a result of the office visit the patient was  continued on both Cipro and Flagyl as well as given Darvocet for pain.  Friday night and most of Saturday afternoon the patient fell okay.  Saturday afternoon, however, the patient began to feel poorly.  He was  excessively tired and febrile.  Despite taking Motrin for fever, the  patient still had a low grade temperature.  This morning Timothy Medina  began having nausea and vomiting and therefore came to the emergency  department as he was unable to hold down any of his oral medications.  In the emergency department Timothy Medina is afebrile.  He has a mildly  elevated white count of 11.2.  CT of  the abdomen and pelvis with IV  contrast is compatible with sigmoid diverticulitis and questionable  early abscess formation.  The patient has required two doses of morphine  since being in the emergency department.  He has had no further nausea  or vomiting with a dose of Zofran.  He is receiving IV fluids.  Vital  signs are stable.  The patient is hyponatremic with a sodium of 131.  Potassium is normal.  Renal function is within normal limits.  Urinalysis is unremarkable.   PAST MEDICAL HISTORY:  Adenomatous polyps.   SOCIAL HISTORY:  Divorced.  The patient is a Runner, broadcasting/film/video.  Nonsmoker.  No  alcohol abuse history.   FAMILY MEDICAL HISTORY:  Colorectal cancer in cousin and uncle, insomnia  and diverticulitis, Alzheimer's, hypertension.   HOME MEDICATION:  1. Cipro 250 mg b.i.d.  2. Flagyl 500 mg 3 times daily.  3. Darvocet N 100 every 4 hours p.r.n.   ALLERGIES:  No known drug  allergies.   REVIEW OF SYSTEMS:  Positive for fevers and fatigue.  All other systems  reviewed and negative other than were noted in the HPI.   PHYSICAL ASSESSMENT:  Temperature 98.2, heart rate 64, respirations 18,  blood pressure 110/56.  Timothy Medina is a pleasant 50 year old male lying  in bed in no acute distress.  HEENT: No scleral icterus.  Conjunctivae pink.  NECK:  Supple.  No palpable Lymphadenopathy.  CARDIAC:  Regular rate and rhythm.  No appreciable murmurs.  RESPIRATORY:  Bilateral lungs are clear.  GI:  Hypoactive bowel sounds.  The abdomen is soft, nondistended.  There  is mild left lower quadrant tenderness to palpation.  No obvious  palpable masses.  EXTREMITIES:  No lower extremity edema.  NEUROLOGIC:  Alert and oriented.  PSYCHOLOGICAL:  Cooperative.   LABORATORY STUDIES:  WBC 11.2, hemoglobin 14.1, hematocrit 42.4, MCV  90.5, platelets 153.  Sodium 131, potassium 4.2, BUN 12, creatinine  0.88, total bilirubin 0.7, alkaline phosphatase 57, AST 17, ALT 38,  lipase 32.  Urinalysis  unremarkable.   DIAGNOSTIC STUDIES:  CT of the abdomen and pelvis with IV contrast  reveals generous amount of stool in the colon.  Extensive inflammatory  changes involving the proximal to mid sigmoid colon.  Bowel wall  thickening somewhat eccentric toward sigmoid mesocolon.  Hypodensities  noted in left posterior sigmoid suspicious for necrosis or possible  early abscess formation.  Similar but slightly less pronounced findings  noted previously.      Willette Cluster, NP      Barbette Hair. Arlyce Dice, MD,FACG  Electronically Signed    PG/MEDQ  D:  03/18/2008  T:  03/18/2008  Job:  [id number]

## 2010-07-07 NOTE — Discharge Summary (Signed)
[name], Timothy NO.:  [id number]   MEDICAL RECORD NO.:  [id number]          PATIENT TYPE:  INP   LOCATION:  1537                         FACILITY:  [facility]   PHYSICIAN:  Barbette Hair. Arlyce Dice, MD,FACGDATE OF BIRTH:  1961-02-09   DATE OF ADMISSION:  03/18/2008  DATE OF DISCHARGE:  03/21/2008                               DISCHARGE SUMMARY   DISCHARGE DIAGNOSIS:  Recurrent sigmoid diverticulitis, resolved.   DISCHARGE MEDICATIONS:  1. Cipro 250 one p.o. b.i.d. for 14 days.  2. Flagyl 500 mg one p.o. t.i.d. FOR 14 days.   DISPOSITION:  Home in stable condition.   CONSULTATIONS:  None requested.   PROCEDURES:  None performed.   HOSPITAL COURSE:  Timothy Medina was admitted March 18, 2008 with  recurrent sigmoid diverticulitis.  The patient had been seen a few days  prior to admission in our office for presumed recurrent diverticulitis  and was actually on antibiotics at the time he was admitted.  The  patient had taken a couple days of antibiotics, but developed nausea and  vomiting and was unable to hold down any further antibiotics so he came  to the emergency department.  CT scan was done in the emergency  department and it did reveal extensive inflammatory changes involving  the proximal to mid sigmoid.  There was a question of early abscess  formation.  At the time of admission the patient's white count mildly  elevated at 11.2, he was afebrile.  The patient had reported chills at  home, so blood cultures were obtained and were negative.  Timothy Medina was  placed on Zosyn upon admission to the hospital.  He showed rapid  improvement tolerating a clear liquid diet on his second day of  admission.  By the third day of admission the patient's diet was  increased to full liquids which he also tolerated well.  He had no  further nausea, vomiting or abdominal pain once admitted to the  hospital.  The patient was stable for discharge on March 21, 2008.  IV  Zosyn  was changed to Flagyl and Cipro upon discharge.  The patient was  advised to complete 2 weeks worth of antibiotics and not to miss any  doses.  He was discharged home on a low-residue diet to advance slowly  to regular food.  The patient was given an appointment to follow up with  his primary gastroenterologist, Dr. Sheryn Bison on April 05, 2008  at 9:30.      Timothy Cluster, NP      Barbette Hair. Arlyce Dice, MD,FACG  Electronically Signed   PG/MEDQ  D:  03/21/2008  T:  03/21/2008  Job:  [id number]

## 2010-10-15 ENCOUNTER — Telehealth: Payer: Self-pay | Admitting: Internal Medicine

## 2010-10-15 NOTE — Telephone Encounter (Signed)
Please remind pt: needs CBC, no urgent, dx thrombocytopenia

## 2010-10-19 NOTE — Telephone Encounter (Signed)
Spoke w/ pt says he recently started new job and doesn't have insurance until sept 1st so pt will call and schedule appt

## 2010-11-25 LAB — COMPREHENSIVE METABOLIC PANEL
AST: 33
Albumin: 3.6
BUN: 11
CO2: 25
Calcium: 8.7
Chloride: 104
Creatinine, Ser: 1.05
GFR calc Af Amer: >60
GFR calc non Af Amer: >60
Total Bilirubin: 0.5

## 2010-11-25 LAB — DIFFERENTIAL
Basophils Absolute: 0
Eosinophils Relative: 2
Lymphocytes Relative: 9 — ABNORMAL LOW
Lymphs Abs: 0.9
Neutro Abs: 7.5

## 2010-11-25 LAB — CBC
HCT: 45.2
MCHC: 33.4
MCV: 89.2
Platelets: 152
WBC: 9.7

## 2010-11-25 LAB — URINALYSIS, ROUTINE W REFLEX MICROSCOPIC
Glucose, UA: NEGATIVE
Nitrite: NEGATIVE
Specific Gravity, Urine: 1.003 — ABNORMAL LOW
pH: 7

## 2010-12-09 ENCOUNTER — Other Ambulatory Visit (INDEPENDENT_AMBULATORY_CARE_PROVIDER_SITE_OTHER): Payer: BC Managed Care – PPO

## 2010-12-09 DIAGNOSIS — D696 Thrombocytopenia, unspecified: Secondary | ICD-10-CM

## 2010-12-09 NOTE — Progress Notes (Signed)
Labs only

## 2010-12-10 LAB — CBC WITH DIFFERENTIAL/PLATELET
Basophils Absolute: 0 10*3/uL (ref 0.0–0.1)
Basophils Relative: 0.4 % (ref 0.0–3.0)
Eosinophils Relative: 1.3 % (ref 0.0–5.0)
HCT: 42.6 % (ref 39.0–52.0)
Hemoglobin: 14.3 g/dL (ref 13.0–17.0)
Lymphocytes Relative: 17.9 % (ref 12.0–46.0)
Lymphs Abs: 1 10*3/uL (ref 0.7–4.0)
Monocytes Relative: 10.3 % (ref 3.0–12.0)
Neutro Abs: 3.9 10*3/uL (ref 1.4–7.7)
RBC: 4.73 Mil/uL (ref 4.22–5.81)
RDW: 12.8 % (ref 11.5–14.6)
WBC: 5.5 10*3/uL (ref 4.5–10.5)

## 2011-01-13 ENCOUNTER — Ambulatory Visit (INDEPENDENT_AMBULATORY_CARE_PROVIDER_SITE_OTHER): Payer: BC Managed Care – PPO | Admitting: Internal Medicine

## 2011-01-13 ENCOUNTER — Encounter: Payer: Self-pay | Admitting: Internal Medicine

## 2011-01-13 VITALS — BP 110/78 | HR 66 | Temp 97.9°F | Ht 67.0 in | Wt 163.6 lb

## 2011-01-13 DIAGNOSIS — J40 Bronchitis, not specified as acute or chronic: Secondary | ICD-10-CM

## 2011-01-13 MED ORDER — BENZONATATE 200 MG PO CAPS: 200.0000 mg | ORAL_CAPSULE | Freq: Three times a day (TID) | ORAL | Status: AC | PRN

## 2011-01-13 MED ORDER — AZITHROMYCIN 250 MG PO TABS: ORAL_TABLET | ORAL | Status: AC

## 2011-01-13 NOTE — Progress Notes (Signed)
  Subjective:    Patient ID: Timothy Medina, male    DOB: 05/01/1960, 50 y.o.   MRN: [id number]  HPI Respiratory tract infection Onset/symptoms:11/14 as head congestion Exposures (illness/environmental/extrinsic):sick students Progression of symptoms:to cough 11/17 Treatments/response:Mucinex, OTC cough syrup Present symptoms: Fever/chills/sweats:no Frontal headache:no Facial pain:no Nasal purulence:no Sore throat:yes Dental pain:no Lymphadenopathy:no Wheezing/shortness of breath:no Cough/sputum/hemoptysis:yellow mucus Pleuritic pain:no Associated extrinsic/allergic symptoms:itchy eyes/ sneezing:no Past medical history: Seasonal allergies: no/asthma:no Smoking history:quit 1982 PMH Diverticulitis      Review of Systems     Objective:   Physical Exam General appearance is of good health and nourishment; no acute distress or increased work of breathing is present.  No  lymphadenopathy about the head, neck, or axilla noted.   Eyes: No conjunctival inflammation or lid edema is present.   Ears:  External ear exam shows no significant lesions or deformities.  Otoscopic examination reveals clear canals, tympanic membranes are intact bilaterally without bulging, retraction, inflammation or discharge.  Nose:  External nasal examination shows no deformity or inflammation. Nasal mucosa are pink and moist without lesions or exudates. No septal dislocation .No obstruction to airflow.   Oral exam: Dental hygiene is good; lips and gums are healthy appearing.There is no oropharyngeal erythema or exudate noted.   Neck:  No deformities, thyromegaly, masses, or tenderness noted.   Supple with full range of motion without pain.   Heart:  Normal rate and regular rhythm. S1 and S2 normal without gallop,  click, rub or other extra sounds. Intermittent Grade 1/2 LSB systolic murmur  Lungs:Chest clear to auscultation; no wheezes, rhonchi,rales ,or rubs present.No increased work of  breathing.    Extremities:  No cyanosis, edema, or clubbing  noted    Skin: Warm & dry           Assessment & Plan:   #1 bronchitis  #2 past history of diverticulitis, inactive  #3 urticaria with meperidine  Plan: See orders and recommendations

## 2011-01-13 NOTE — Patient Instructions (Signed)
Plain Mucinex for thick secretions ;force NON dairy fluids. Use a Neti pot daily as needed for sinus congestion  

## 2011-01-19 ENCOUNTER — Encounter: Payer: Self-pay | Admitting: Internal Medicine

## 2011-01-22 ENCOUNTER — Ambulatory Visit (INDEPENDENT_AMBULATORY_CARE_PROVIDER_SITE_OTHER): Payer: BC Managed Care – PPO | Admitting: Internal Medicine

## 2011-01-22 ENCOUNTER — Encounter: Payer: Self-pay | Admitting: Internal Medicine

## 2011-01-22 VITALS — BP 100/78 | HR 65 | Temp 97.8°F | Wt 164.0 lb

## 2011-01-22 DIAGNOSIS — G4700 Insomnia, unspecified: Secondary | ICD-10-CM

## 2011-01-22 DIAGNOSIS — R011 Cardiac murmur, unspecified: Secondary | ICD-10-CM

## 2011-01-22 MED ORDER — ALPRAZOLAM 0.25 MG PO TABS: 0.2500 mg | ORAL_TABLET | Freq: Every evening | ORAL | Status: AC | PRN

## 2011-01-22 MED ORDER — ALPRAZOLAM 0.25 MG PO TBDP: 0.2500 mg | ORAL_TABLET | Freq: Every evening | ORAL | Status: DC | PRN

## 2011-01-22 NOTE — Assessment & Plan Note (Addendum)
Today, he also like to discuss insomnia.   history of inability to sleep more than 4-6 hours. In the past Valium helped, Remus Loffler make him drowsy the next morning. Last week, his neighbor gave him a low dose of Xanax and it worked pretty well. He has no history of substance abuse, and he is aware of slight risk of addiction. We agreed to try Alprazolam   (I mistakenly prescribed Niravam,  prescription was destroyed and he received a regular Xanax prescription)

## 2011-01-22 NOTE — Assessment & Plan Note (Addendum)
Question of heart murmur, patient is quite athletic  and asymptomatic. Previous EKGs reviewed, they are stable, I',m planning to discuss them with cardiology and see if further testing is needed Addendum: previous EKGs d/w cards, noted early repol, no conduction defects.---JP

## 2011-01-22 NOTE — Progress Notes (Signed)
  Subjective:    Patient ID: Timothy Medina, male    DOB: Jul 15, 1960, 50 y.o.   MRN: [id number]  HPI Was seen recently with bronchitis but also was told he has heart murmur and is concerned about it. He is a runner, usually runs 3 miles a day without chest pain, shortness of breath. No history of syncope .  Past Medical History  Diagnosis Date  . FH: colonic polyps   . Insomnia   . GERD (gastroesophageal reflux disease)   . Diverticulitis   . Interstitial cystitis     History   Social History  . Marital Status: Divorced    Spouse Name: N/A    Number of Children: 0  . Years of Education: N/A   Occupational History  .  [employer]   Social History Main Topics  . Smoking status: Never Smoker   . Smokeless tobacco: Not on file  . Alcohol Use: No  . Drug Use: Not on file  . Sexually Active: Not on file   Other Topics Concern  . Not on file   Social History Narrative   Walks daily and lifts weights         Review of Systems Since the last office visit, he took the medication as prescribed, he is doing better. Denies any fever or chills He is still producing some yellow mucus. No hemoptysis. No shortness of breath.     Objective:   Physical Exam  Constitutional: He appears well-developed and well-nourished.  HENT:  Head: Normocephalic and atraumatic.  Right Ear: External ear normal.  Left Ear: External ear normal.  Cardiovascular: Normal rate, regular rhythm and normal heart sounds.        Question of a systolic murmur, if present is really mild   Pulmonary/Chest: Breath sounds normal. No respiratory distress. He has no wheezes. He has no rales.       Assessment & Plan:  Bronchitis: Resolving

## 2011-02-04 ENCOUNTER — Encounter: Payer: Self-pay | Admitting: Internal Medicine

## 2011-02-08 ENCOUNTER — Ambulatory Visit (INDEPENDENT_AMBULATORY_CARE_PROVIDER_SITE_OTHER): Payer: BC Managed Care – PPO | Admitting: Internal Medicine

## 2011-02-08 VITALS — BP 110/78 | HR 82 | Temp 98.2°F | Ht 66.0 in | Wt 163.0 lb

## 2011-02-08 DIAGNOSIS — R52 Pain, unspecified: Secondary | ICD-10-CM

## 2011-02-08 DIAGNOSIS — J069 Acute upper respiratory infection, unspecified: Secondary | ICD-10-CM

## 2011-02-08 DIAGNOSIS — R6883 Chills (without fever): Secondary | ICD-10-CM

## 2011-02-08 DIAGNOSIS — Z20828 Contact with and (suspected) exposure to other viral communicable diseases: Secondary | ICD-10-CM

## 2011-02-08 LAB — POCT INFLUENZA A/B
Influenza A, POC: NEGATIVE
Influenza B, POC: NEGATIVE

## 2011-02-08 MED ORDER — HYDROCODONE-HOMATROPINE 5-1.5 MG/5ML PO SYRP: 5.0000 mL | ORAL_SOLUTION | Freq: Three times a day (TID) | ORAL | Status: AC | PRN

## 2011-02-08 NOTE — Patient Instructions (Signed)
Rest, fluids , tylenol For cough, take Mucinex DM twice a day as needed  For congestion use Sudafed (pseudoephedrine) behind the counter 30 mg every 4 to 6 hours as needed If you still have cough, use hydrocodone (drowsiness) Call if no better in few days Call anytime if the symptoms are severe

## 2011-02-08 NOTE — Progress Notes (Signed)
  Subjective:    Patient ID: Timothy Medina, male    DOB: [birth year], 50 y.o.   MRN: [id number]  HPI Flu? Sx started 3 days WGN:FAOZHY , persistent cough, not feeling well in general, weak  Past Medical History: COLONIC POLYPS   INSOMNIA-SLEEP DISORDER-UNSPEC  Hx of INTERSTITIAL CYSTITIS   h/o diverticulitis  GERD  Past Surgical History: Denies surgical history    Family History: father is doing well, 12 y/o hypertension-- M diabetes--no heart attacks--no. 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  Alcohol Use - no diet-- exercise--  runs 10 miles a week, wt lifting almost daily    Review of Systems No fever, mild chills? Some cough w/yellow sputum, mild clear RN No N V D No myalgias     Objective:   Physical Exam  Constitutional: He appears well-developed and well-nourished. No distress.  HENT:  Head: Normocephalic and atraumatic.  Right Ear: External ear normal.  Left Ear: External ear normal.  Mouth/Throat: No oropharyngeal exudate.       Face symmetric, no TTP, nose congested   Cardiovascular: Normal rate, regular rhythm and normal heart sounds.   No murmur heard. Pulmonary/Chest: Effort normal and breath sounds normal. No respiratory distress. He has no wheezes. He has no rales.  Skin: He is not diaphoretic.      Assessment & Plan:  URI: Flu test neg Sx likely due to URI with persistent cough, flu test neg, see instructions Will Rx hydrocodone (allergic to meperidine but was Rx oxycodone before)

## 2011-02-09 ENCOUNTER — Telehealth: Payer: Self-pay | Admitting: Internal Medicine

## 2011-02-09 ENCOUNTER — Ambulatory Visit (INDEPENDENT_AMBULATORY_CARE_PROVIDER_SITE_OTHER)
Admission: RE | Admit: 2011-02-09 | Discharge: 2011-02-09 | Disposition: A | Discharge status: Home / Self Care | Payer: BC Managed Care – PPO | Source: Ambulatory Visit | Attending: Internal Medicine | Admitting: Internal Medicine

## 2011-02-09 DIAGNOSIS — R05 Cough: Secondary | ICD-10-CM

## 2011-02-09 NOTE — Telephone Encounter (Signed)
I think is going to take more time to get better. If the cough is that severe, ask patient to have a chest x-ray just to be sure, orders already entered

## 2011-02-09 NOTE — Telephone Encounter (Signed)
Patient informed. 

## 2011-02-09 NOTE — Telephone Encounter (Signed)
Please advise 

## 2011-02-09 NOTE — Telephone Encounter (Signed)
Patient states he was seen yesterday for a persistant cough and was given antibiotics. He states that he is not feeling any better and feels like the antibiotic is not working.

## 2011-02-23 ENCOUNTER — Telehealth: Payer: Self-pay | Admitting: Internal Medicine

## 2011-02-23 NOTE — Telephone Encounter (Signed)
Records from the ER @ [place] reviewed, CT showed diverticulitis, WBC 15 k. Was Rx abx Pt needs an appointment next week if he is feeling better, this week if not improved, back to the Er if getting worse

## 2011-02-24 NOTE — Telephone Encounter (Signed)
Left message to call office

## 2011-02-25 ENCOUNTER — Telehealth: Payer: Self-pay | Admitting: Gastroenterology

## 2011-02-25 NOTE — Telephone Encounter (Signed)
Notified pt the meds are the correct dose and that Dr Jarold Motto will address his fatigue at his OV; pt stated understanding.

## 2011-02-25 NOTE — Telephone Encounter (Signed)
Pt reports a Diverticular flare in the past few days. He went to [facility] and had a CT scan. He had passed blood one time and was having left side pain with a slight temp. ER gave him Phenergan, Vicodin, Cipro 500mg  bid and Metronidazole 500 bid. Pt reports before the ER visit and NOW, he has no energy. He denies any blood in stool, diarrhea and he states his pain is tolerable. He has not taken any Phenergan or Vicodin. Should pt's energy be so low and is this the correct dose of Cipro and Flagyl? Pt has an appt 03/04/11. Please advise.

## 2011-02-25 NOTE — Telephone Encounter (Signed)
Correct doses

## 2011-03-01 ENCOUNTER — Encounter: Payer: Self-pay | Admitting: *Deleted

## 2011-03-04 ENCOUNTER — Ambulatory Visit (INDEPENDENT_AMBULATORY_CARE_PROVIDER_SITE_OTHER): Payer: BC Managed Care – PPO | Admitting: Gastroenterology

## 2011-03-04 ENCOUNTER — Encounter: Payer: Self-pay | Admitting: Gastroenterology

## 2011-03-04 VITALS — BP 112/84 | HR 82 | Ht 67.0 in | Wt 159.4 lb

## 2011-03-04 DIAGNOSIS — K5732 Diverticulitis of large intestine without perforation or abscess without bleeding: Secondary | ICD-10-CM

## 2011-03-04 NOTE — Progress Notes (Signed)
This is a 51 year old Caucasian male recently treated for diverticulitis confirmed by CT scan in mid December. At that time he was having left lower quadrant pain, and had one episode of hematochezia. Emergency room visit resulted in his being treated with Cipro and metronidazole which he took apparently for 5 days. He currently is asymptomatic, and denies abdominal pain or bowel irregularity or continued rectal bleeding. He does have a history of colon cancer in his cousin, he did have a previous colon polyp removed 3 years ago at the time of colonoscopy. He follows a regular diet and denies any food intolerances, upper GI or hepatobiliary complaints. Otherwise, he is healthy and is on no medications.  Current Medications, Allergies, Past Medical History, Past Surgical History, Family History and Social History were reviewed in [employer] record.  Pertinent Review of Systems Negative   Physical Exam: Healthy-appearing patient in no acute distress. I cannot appreciate stigmata of chronic liver disease. Chest is clear and he appears to be in a regular rhythm without murmurs gallops or rubs. There is no organomegaly, abdominal masses or abdominal tenderness except to deep palpation in the left lower quadrant. There is no rebound tenderness or abdominal masses.. Bowel sounds are normal. Peripheral extremities are unremarkable and mental status is normal.    Assessment and Plan: Recent episode of subacute diverticulitis which has responded to standard medical therapy. He apparently had a similar episode 3 years ago. I've scheduled him for followup colonoscopy in 4-6 weeks' time per his history of rectal bleeding, and a positive family history of colon cancer. I've asked him to follow a high fiber diet and to use liberal by mouth fluids. No diagnosis found.

## 2011-03-04 NOTE — Patient Instructions (Signed)
Call back to schedule your colonoscopy and pre visit.   Diverticulitis A diverticulum is a small pouch or sac on the colon. Diverticulosis is the presence of these diverticula on the colon. Diverticulitis is the irritation (inflammation) or infection of diverticula. CAUSES  The colon and its diverticula contain bacteria. If food particles block the tiny opening to a diverticulum, the bacteria inside can grow and cause an increase in pressure. This leads to infection and inflammation and is called diverticulitis. SYMPTOMS   Abdominal pain and tenderness. Usually, the pain is located on the left side of your abdomen. However, it could be located elsewhere.   Fever.   Bloating.   Feeling sick to your stomach (nausea).   Throwing up (vomiting).   Abnormal stools.  DIAGNOSIS  Your caregiver will take a history and perform a physical exam. Since many things can cause abdominal pain, other tests may be necessary. Tests may include:  Blood tests.   Urine tests.   X-ray of the abdomen.   CT scan of the abdomen.  Sometimes, surgery is needed to determine if diverticulitis or other conditions are causing your symptoms. TREATMENT  Most of the time, you can be treated without surgery. Treatment includes:  Resting the bowels by only having liquids for a few days. As you improve, you will need to eat a low-fiber diet.   Intravenous (IV) fluids if you are losing body fluids (dehydrated).   Antibiotic medicines that treat infections may be given.   Pain and nausea medicine, if needed.   Surgery if the inflamed diverticulum has burst.  HOME CARE INSTRUCTIONS   Try a clear liquid diet (broth, tea, or water for as long as directed by your caregiver). You may then gradually begin a low-fiber diet as tolerated. A low-fiber diet is a diet with less than 10 grams of fiber. Choose the foods below to reduce fiber in the diet:   White breads, cereals, rice, and pasta.   Cooked fruits and  vegetables or soft fresh fruits and vegetables without the skin.   Ground or well-cooked tender beef, ham, veal, lamb, pork, or poultry.   Eggs and seafood.   After your diverticulitis symptoms have improved, your caregiver may put you on a high-fiber diet. A high-fiber diet includes 14 grams of fiber for every 1000 calories consumed. For a standard 2000 calorie diet, you would need 28 grams of fiber. Follow these diet guidelines to help you increase the fiber in your diet. It is important to slowly increase the amount fiber in your diet to avoid gas, constipation, and bloating.   Choose whole-grain breads, cereals, pasta, and brown rice.   Choose fresh fruits and vegetables with the skin on. Do not overcook vegetables because the more vegetables are cooked, the more fiber is lost.   Choose more nuts, seeds, legumes, dried peas, beans, and lentils.   Look for food products that have greater than 3 grams of fiber per serving on the Nutrition Facts label.   Take all medicine as directed by your caregiver.   If your caregiver has given you a follow-up appointment, it is very important that you go. Not going could result in lasting (chronic) or permanent injury, pain, and disability. If there is any problem keeping the appointment, call to reschedule.  SEEK MEDICAL CARE IF:   Your pain does not improve.   You have a hard time advancing your diet beyond clear liquids.   Your bowel movements do not return to normal.  SEEK IMMEDIATE MEDICAL CARE IF:   Your pain becomes worse.   You have an oral temperature above 102 F (38.9 C), not controlled by medicine.   You have repeated vomiting.   You have bloody or black, tarry stools.   Symptoms that brought you to your caregiver become worse or are not getting better.  MAKE SURE YOU:   Understand these instructions.   Will watch your condition.   Will get help right away if you are not doing well or get worse.  Document Released:  11/18/2004 Document Revised: 10/21/2010 Document Reviewed: 03/16/2010 [facility] Patient Information 2012 [place], [place].

## 2011-03-05 NOTE — Telephone Encounter (Signed)
Left message to call office

## 2011-03-09 NOTE — Telephone Encounter (Signed)
Pt states that he is doing fine now and has scheduled to see GI and is to have another colonoscopy done. Pt decline to schedule OV to come in to see Dr Drue Novel since he is already F/U with GI.

## 2011-03-09 NOTE — Telephone Encounter (Signed)
Agree, no need to be seen here, thank you

## 2011-03-11 ENCOUNTER — Telehealth: Payer: Self-pay | Admitting: Gastroenterology

## 2011-03-11 NOTE — Telephone Encounter (Signed)
Informed pt after speaking with Dr Rhea Belton, that he could be bleeding from a diverticula, hemorrhoid or a fissure. Monitor the bleeding and if he continues to have bleeding or if the bleeding becomes severe, he should call to be seen and/or have labs. It 's safe to wait on the Colon because the wall of the colon is vulnerable to perforation after the bout of diverticulitis. Pt will call for worsening s&s.

## 2011-03-11 NOTE — Telephone Encounter (Signed)
Pt called me on 02/25/11 questioning the dose of meds given to him for a diverticular flare in late December at [facility]. Dr Jarold Motto agreed with Cipro 500mg  bid and metronidazole 500 bid. At that time he denied any diarrhea or melena, and f/u with Dr Jarold Motto on 03/04/11. At OV he stated he took the meds for 5 days and was asymptomatic. He had a colon polyp removed 3 years ago by Dr Jarold Motto and his cousin had colon cancer. Dr Norval Gable assessment was that the subacute episode of diverticulitis responded to standard medical therapy and he will be scheduled for f/u COLON in 4-6 weeks. Pt instructed to increase fluids and continue hi fiber diet. Today, pt reports he is passing more blood and he doesn't know if he should wait 4-6 weeks for a COLON. He feels this is a bleeding POLYP and he shouldn't wait. He reports the bleeding has been intermittent x 1 month. He denies diarrhea and states his stools are normal, but this am there was BRB on the tissue and the bowl was bright red. Please advise.

## 2011-03-12 ENCOUNTER — Ambulatory Visit: Payer: BC Managed Care – PPO

## 2011-03-15 ENCOUNTER — Ambulatory Visit (INDEPENDENT_AMBULATORY_CARE_PROVIDER_SITE_OTHER): Payer: BC Managed Care – PPO | Admitting: *Deleted

## 2011-03-15 VITALS — Temp 97.8°F

## 2011-03-15 DIAGNOSIS — Z23 Encounter for immunization: Secondary | ICD-10-CM

## 2011-03-29 ENCOUNTER — Telehealth: Payer: Self-pay | Admitting: *Deleted

## 2011-03-29 NOTE — Telephone Encounter (Signed)
Message copied by Florene Glen on Mon Mar 29, 2011  8:12 AM ------      Message from: Florene Glen      Created: Thu Mar 11, 2011  1:31 PM       Call pt to schedule COLON

## 2011-03-29 NOTE — Telephone Encounter (Signed)
Spoke with pt to schedule his COLON. Pt stated he will call back when he sees the Spring Break schedule.

## 2011-04-19 ENCOUNTER — Ambulatory Visit (AMBULATORY_SURGERY_CENTER): Payer: BC Managed Care – PPO | Admitting: *Deleted

## 2011-04-19 ENCOUNTER — Encounter: Payer: Self-pay | Admitting: Gastroenterology

## 2011-04-19 VITALS — Ht 67.0 in | Wt 161.2 lb

## 2011-04-19 DIAGNOSIS — Z1211 Encounter for screening for malignant neoplasm of colon: Secondary | ICD-10-CM

## 2011-04-19 MED ORDER — PEG-KCL-NACL-NASULF-NA ASC-C 100 G PO SOLR: ORAL | Status: DC

## 2011-04-19 NOTE — Progress Notes (Signed)
No allergy to eggs or soy products 

## 2011-04-26 ENCOUNTER — Ambulatory Visit (AMBULATORY_SURGERY_CENTER): Payer: BC Managed Care – PPO | Admitting: Gastroenterology

## 2011-04-26 ENCOUNTER — Encounter: Payer: Self-pay | Admitting: Gastroenterology

## 2011-04-26 DIAGNOSIS — R1032 Left lower quadrant pain: Secondary | ICD-10-CM

## 2011-04-26 DIAGNOSIS — K5732 Diverticulitis of large intestine without perforation or abscess without bleeding: Secondary | ICD-10-CM

## 2011-04-26 DIAGNOSIS — Z1211 Encounter for screening for malignant neoplasm of colon: Secondary | ICD-10-CM

## 2011-04-26 DIAGNOSIS — K5730 Diverticulosis of large intestine without perforation or abscess without bleeding: Secondary | ICD-10-CM

## 2011-04-26 MED ORDER — SODIUM CHLORIDE 0.9 % IV SOLN: 500.0000 mL | INTRAVENOUS | Status: DC

## 2011-04-26 NOTE — Op Note (Signed)
[facility] [address] [place], [place]  [address]  COLONOSCOPY PROCEDURE REPORT  PATIENT:  Timothy Medina, Timothy Medina  MR#:  [id number] BIRTHDATE:  November 10, 1960, 51 yrs. old  GENDER:  male ENDOSCOPIST:  Vania Rea. Jarold Motto, MD, [facility] REF. BY: PROCEDURE DATE:  04/26/2011 PROCEDURE:  Average-risk screening colonoscopy G0121 ASA CLASS:  Class I INDICATIONS:  Abdominal pain, Routine Risk Screening MEDICATIONS:   propofol (Diprivan) 240 mg IV  DESCRIPTION OF PROCEDURE:   After the risks and benefits and of the procedure were explained, informed consent was obtained. Digital rectal exam was performed and revealed no abnormalities. The LB PCF-H180AL [id number] endoscope was introduced through the anus and advanced to the cecum, which was identified by both the appendix and ileocecal valve.  The quality of the prep was excellent, using MoviPrep.  The instrument was then slowly withdrawn as the colon was fully examined. <<PROCEDUREIMAGES>>  FINDINGS:  There were mild diverticular changes in left colon. diverticulosis was found. SOME RED HAUSTRAL FOLDS IN SIGMOID AREA Internal Hemorrhoids were found. SMALL   Retroflexed views in the rectum revealed hemorrhoids.    The scope was then withdrawn from the patient and the procedure completed.  COMPLICATIONS:  None ENDOSCOPIC IMPRESSION: 1) Diverticulosis,mild,left sided diverticulosis 2) Internal hemorrhoids RECOMMENDATIONS: 1) High fiber diet. 2) Repeat Colonscopy in 10 years.  REPEAT EXAM:  No  ______________________________ Vania Rea. Jarold Motto, MD, [facility]  CC:  Willow Ora, MD  n. Rosalie Doctor:   Vania Rea. Gisel Vipond at 04/26/2011 11:49 AM  Nolon Nations, [phone]

## 2011-04-26 NOTE — Patient Instructions (Signed)
Discharge instructions given with verbal understanding. Handouts on diverticulosis and hemorrhoids given. Resume previous medications. YOU HAD AN ENDOSCOPIC PROCEDURE TODAY AT THE [facility]: Refer to the procedure report that was given to you for any specific questions about what was found during the examination.  If the procedure report does not answer your questions, please call your gastroenterologist to clarify.  If you requested that your care partner not be given the details of your procedure findings, then the procedure report has been included in a sealed envelope for you to review at your convenience later.  YOU SHOULD EXPECT: Some feelings of bloating in the abdomen. Passage of more gas than usual.  Walking can help get rid of the air that was put into your GI tract during the procedure and reduce the bloating. If you had a lower endoscopy (such as a colonoscopy or flexible sigmoidoscopy) you may notice spotting of blood in your stool or on the toilet paper. If you underwent a bowel prep for your procedure, then you may not have a normal bowel movement for a few days.  DIET: Your first meal following the procedure should be a light meal and then it is ok to progress to your normal diet.  A half-sandwich or bowl of soup is an example of a good first meal.  Heavy or fried foods are harder to digest and may make you feel nauseous or bloated.  Likewise meals heavy in dairy and vegetables can cause extra gas to form and this can also increase the bloating.  Drink plenty of fluids but you should avoid alcoholic beverages for 24 hours.  ACTIVITY: Your care partner should take you home directly after the procedure.  You should plan to take it easy, moving slowly for the rest of the day.  You can resume normal activity the day after the procedure however you should NOT DRIVE or use heavy machinery for 24 hours (because of the sedation medicines used during the test).    SYMPTOMS TO REPORT  IMMEDIATELY: A gastroenterologist can be reached at any hour.  During normal business hours, 8:30 AM to 5:00 PM Monday through Friday, call [phone].  After hours and on weekends, please call the GI answering service at [phone] who will take a message and have the physician on call contact you.   Following lower endoscopy (colonoscopy or flexible sigmoidoscopy):  Excessive amounts of blood in the stool  Significant tenderness or worsening of abdominal pains  Swelling of the abdomen that is new, acute  Fever of 100F or higher  FOLLOW UP: If any biopsies were taken you will be contacted by phone or by letter within the next 1-3 weeks.  Call your gastroenterologist if you have not heard about the biopsies in 3 weeks.  Our staff will call the home number listed on your records the next business day following your procedure to check on you and address any questions or concerns that you may have at that time regarding the information given to you following your procedure. This is a courtesy call and so if there is no answer at the home number and we have not heard from you through the emergency physician on call, we will assume that you have returned to your regular daily activities without incident.  SIGNATURES/CONFIDENTIALITY: You and/or your care partner have signed paperwork which will be entered into your electronic medical record.  These signatures attest to the fact that that the information above on your After Visit   Summary has been reviewed and is understood.  Full responsibility of the confidentiality of this discharge information lies with you and/or your care-partner. 

## 2011-04-26 NOTE — Progress Notes (Signed)
No complaints noted in the recovery room. Maw  Patient did not have preoperative order for IV antibiotic SSI prophylaxis. (G8918) Patient did not experience any of the following events: a burn prior to discharge; a fall within the facility; wrong site/side/patient/procedure/implant event; or a hospital transfer or hospital admission upon discharge from the facility. (G8907)  

## 2011-04-27 ENCOUNTER — Telehealth: Payer: Self-pay

## 2011-04-27 NOTE — Telephone Encounter (Signed)
  Follow up Call-  Call back number 04/26/2011  Post procedure Call Back phone  # [phone] cell  Permission to leave phone message Yes     Patient questions:  Do you have a fever, pain , or abdominal swelling? no Pain Score  0 *  Have you tolerated food without any problems? yes  Have you been able to return to your normal activities? yes  Do you have any questions about your discharge instructions: Diet   no Medications  no Follow up visit  no  Do you have questions or concerns about your Care? no  Actions: * If pain score is 4 or above: No action needed, pain <4.

## 2011-07-30 ENCOUNTER — Encounter: Payer: BC Managed Care – PPO | Admitting: Internal Medicine

## 2011-09-02 ENCOUNTER — Encounter: Payer: BC Managed Care – PPO | Admitting: Internal Medicine

## 2011-12-03 ENCOUNTER — Encounter: Payer: Self-pay | Admitting: Internal Medicine

## 2011-12-03 ENCOUNTER — Ambulatory Visit (INDEPENDENT_AMBULATORY_CARE_PROVIDER_SITE_OTHER): Payer: BC Managed Care – PPO | Admitting: Internal Medicine

## 2011-12-03 VITALS — BP 104/68 | HR 77 | Temp 98.6°F | Wt 167.6 lb

## 2011-12-03 DIAGNOSIS — Z0000 Encounter for general adult medical examination without abnormal findings: Secondary | ICD-10-CM | POA: Insufficient documentation

## 2011-12-03 DIAGNOSIS — G4700 Insomnia, unspecified: Secondary | ICD-10-CM

## 2011-12-03 DIAGNOSIS — R011 Cardiac murmur, unspecified: Secondary | ICD-10-CM

## 2011-12-03 NOTE — Assessment & Plan Note (Addendum)
Td  08 Had a  flu shot  colonoscopy 2010, polyp Repeated cscope 04-2011, tics, next in 10 years rec.  to continue with his healthy lifestyl  blood work will do an HIV, VDRL , PSA and G&C per patient request. RTC 1 year

## 2011-12-03 NOTE — Assessment & Plan Note (Signed)
No murmur noted today

## 2011-12-03 NOTE — Patient Instructions (Signed)
Please come back fasting: CMP, FLP, TSH, CBC, PSA, HIV, VDRL--- dx v70 Urine for  G&C--- dx v70

## 2011-12-03 NOTE — Assessment & Plan Note (Signed)
Took alprazolam temporarily, currently doing well, sleeps 6-7 hours at night and feels well.

## 2011-12-03 NOTE — Progress Notes (Signed)
  Subjective:    Patient ID: Timothy Medina, male    DOB: October 06, 1960, 51 y.o.   MRN: [id number]  HPI CPX  Past medical history GERD Diverticulitis colon polyps Interstitial cystitis Insomnia  Past surgical history No  Family History: father is doing well, 31 y/o hypertension-- M diabetes--no heart attacks--no. 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  Alcohol Use - no exercise--  runs 6 miles a week, wt lifting almost daily  Diet-- healthy   Review of Systems  Respiratory: Negative for cough, shortness of breath and wheezing.   Cardiovascular: Negative for chest pain and leg swelling.  Gastrointestinal: Negative for abdominal pain and blood in stool.  Genitourinary: Negative for dysuria and hematuria.  Psychiatric/Behavioral:       No depression or anxiety        Objective:   Physical Exam  General -- alert, well-developed, and well-nourished.   Lungs -- normal respiratory effort, no intercostal retractions, no accessory muscle use, and normal breath sounds.   Heart-- normal rate, regular rhythm, no murmur noted today .   Abdomen--soft, non-tender, no distention, no masses, no HSM, no guarding, and no rigidity.   Extremities-- no pretibial edema bilaterally Rectal-- No external abnormalities noted. Normal sphincter tone. No rectal masses or tenderness. Brown stool  Prostate:  Prostate gland firm and smooth, no enlargement, nodularity, tenderness, mass, asymmetry or induration. Neurologic-- alert & oriented X3 and strength normal in all extremities. Psych-- Cognition and judgment appear intact. Alert and cooperative with normal attention span and concentration.  not anxious appearing and not depressed appearing.       Assessment & Plan:

## 2011-12-07 ENCOUNTER — Other Ambulatory Visit (INDEPENDENT_AMBULATORY_CARE_PROVIDER_SITE_OTHER): Payer: BC Managed Care – PPO

## 2011-12-07 DIAGNOSIS — Z0000 Encounter for general adult medical examination without abnormal findings: Secondary | ICD-10-CM

## 2011-12-07 LAB — COMPREHENSIVE METABOLIC PANEL
ALT: 22 U/L (ref 0–53)
AST: 19 U/L (ref 0–37)
Alkaline Phosphatase: 48 U/L (ref 39–117)
BUN: 19 mg/dL (ref 6–23)
Calcium: 9 mg/dL (ref 8.4–10.5)
Creatinine, Ser: 1 mg/dL (ref 0.4–1.5)
Total Bilirubin: 0.7 mg/dL (ref 0.3–1.2)

## 2011-12-07 LAB — LIPID PANEL
HDL: 34.4 mg/dL — ABNORMAL LOW (ref >39.00)
LDL Cholesterol: 129 mg/dL — ABNORMAL HIGH (ref 0–99)
Total CHOL/HDL Ratio: 5
Triglycerides: 72 mg/dL (ref 0.0–149.0)
VLDL: 14.4 mg/dL (ref 0.0–40.0)

## 2011-12-07 LAB — CBC WITH DIFFERENTIAL/PLATELET
Basophils Absolute: 0 10*3/uL (ref 0.0–0.1)
Eosinophils Absolute: 0.1 10*3/uL (ref 0.0–0.7)
Hemoglobin: 14.5 g/dL (ref 13.0–17.0)
Lymphocytes Relative: 18.3 % (ref 12.0–46.0)
MCHC: 32.8 g/dL (ref 30.0–36.0)
Neutro Abs: 2.7 10*3/uL (ref 1.4–7.7)
Platelets: 159 10*3/uL (ref 150.0–400.0)
RDW: 13.4 % (ref 11.5–14.6)

## 2011-12-07 LAB — RPR

## 2011-12-07 LAB — PSA: PSA: 1.14 ng/mL (ref 0.10–4.00)

## 2011-12-08 NOTE — Addendum Note (Signed)
Addended by: Silvio Pate D on: 12/08/2011 01:29 PM   Modules accepted: Orders

## 2011-12-09 LAB — GC/CHLAMYDIA PROBE AMP, URINE: GC Probe Amp, Urine: NEGATIVE

## 2011-12-10 ENCOUNTER — Encounter: Payer: Self-pay | Admitting: *Deleted

## 2012-01-26 ENCOUNTER — Telehealth: Payer: Self-pay | Admitting: Gastroenterology

## 2012-01-26 NOTE — Telephone Encounter (Signed)
Informed pt of Dr Norval Gable orders for Cipro and Flagyl and he stated that those AB are too strong for him. On a scale on 1-10 felling normal, he states he's a 8-9; it's just a twinge he has every once in a while. He states the AB caused more harm to him last time and he felt worse when on them. He will wait until Friday, 01/28/12, see how he feels and let me know.

## 2012-01-26 NOTE — Telephone Encounter (Signed)
About a year ago pt reports a bout of Diverticulitis; he was seen at an ER and CT dx the diverticulitis. He was given Cipro and Flagyl and f/u with Dr Jarold Motto in January, 2013. Pt reports he is having the same pain he had last year when he was dx with diverticulitis; pain was below the waistline and in the middle of his abdomen. He denies any rectal bleeding or fever. He normally watches his diet and tries to avoid spicey foods; he did eat sweet potatoes with nuts on top Thanksgiving. Pt states he had problems before with nuts and cherry tomatoes. Does pt need to be seen? Thanks.

## 2012-01-26 NOTE — Telephone Encounter (Signed)
Cipro 500mg  bid and metroniedazole 500 mg bid for 10 days and prn levsin ,,,see me 1 month

## 2012-01-28 ENCOUNTER — Telehealth: Payer: Self-pay | Admitting: Gastroenterology

## 2012-01-28 NOTE — Telephone Encounter (Signed)
Pt reports his pain is gone, but he would like to f/u with Dr Jarold Motto. appt 03/01/11 at 08:45am.

## 2012-02-29 ENCOUNTER — Ambulatory Visit: Payer: BC Managed Care – PPO | Admitting: Gastroenterology

## 2012-03-09 ENCOUNTER — Encounter: Payer: Self-pay | Admitting: Gastroenterology

## 2012-03-09 ENCOUNTER — Ambulatory Visit (INDEPENDENT_AMBULATORY_CARE_PROVIDER_SITE_OTHER): Payer: BC Managed Care – PPO | Admitting: Gastroenterology

## 2012-03-09 VITALS — BP 94/64 | HR 54 | Ht 67.25 in | Wt 165.0 lb

## 2012-03-09 DIAGNOSIS — K5730 Diverticulosis of large intestine without perforation or abscess without bleeding: Secondary | ICD-10-CM

## 2012-03-09 DIAGNOSIS — R1012 Left upper quadrant pain: Secondary | ICD-10-CM

## 2012-03-09 MED ORDER — HYOSCYAMINE SULFATE 0.125 MG SL SUBL: 0.1250 mg | SUBLINGUAL_TABLET | Freq: Four times a day (QID) | SUBLINGUAL | Status: DC | PRN

## 2012-03-09 NOTE — Progress Notes (Signed)
This is a 52 year old Caucasian male who has recurrent diverticulitis.  He had a couple of days of left upper quadrant discomfort in December which has resolved.  He currently is having regular bowel movements without pain, melena or hematochezia.  He denies upper GI or hepatobiliary complaints.  His appetite is good, and he is on a high fiber diet.  Current Medications, Allergies, Past Medical History, Past Surgical History, Family History and Social History were reviewed in [employer] record.  Pertinent Review of Systems Negative   Physical Exam: Blood pressure 94 / 64, pulse 54 and regular, and weight 165 with a BMI of 25.65.  He is a healthy-appearing male in no distress.  There is no hepatosplenomegaly, abdominal masses or tenderness.  Bowel sounds are normal.  Mental status is normal.    Assessment and Plan: Chronic diverticulosis with a history of periodic diverticulitis.  I have advised him about a high fiber diet, daily Metamucil and liberal by mouth fluids.  I've given him Levsin to use sublingually on a when necessary basis for colon spasms.  He is up-to-date on his colonoscopy exam, and does not need repeat imaging studies at this time. No diagnosis found.

## 2012-03-09 NOTE — Patient Instructions (Addendum)
Diverticulosis Diverticulosis is a common condition that develops when small pouches (diverticula) form in the wall of the colon. The risk of diverticulosis increases with age. It happens more often in people who eat a low-fiber diet. Most individuals with diverticulosis have no symptoms. Those individuals with symptoms usually experience abdominal pain, constipation, or loose stools (diarrhea). HOME CARE INSTRUCTIONS   Increase the amount of fiber in your diet as directed by your caregiver or dietician. This may reduce symptoms of diverticulosis.  Your caregiver may recommend taking a dietary fiber supplement.  Drink at least 6 to 8 glasses of water each day to prevent constipation.  Try not to strain when you have a bowel movement.  Your caregiver may recommend avoiding nuts and seeds to prevent complications, although this is still an uncertain benefit.  Only take over-the-counter or prescription medicines for pain, discomfort, or fever as directed by your caregiver. FOODS WITH HIGH FIBER CONTENT INCLUDE:  Fruits. Apple, peach, pear, tangerine, raisins, prunes.  Vegetables. Brussels sprouts, asparagus, broccoli, cabbage, carrot, cauliflower, romaine lettuce, spinach, summer squash, tomato, winter squash, zucchini.  Starchy Vegetables. Baked beans, kidney beans, lima beans, split peas, lentils, potatoes (with skin).  Grains. Whole wheat bread, brown rice, bran flake cereal, plain oatmeal, white rice, shredded wheat, bran muffins. SEEK IMMEDIATE MEDICAL CARE IF:   You develop increasing pain or severe bloating.  You have an oral temperature above 102 F (38.9 C), not controlled by medicine.  You develop vomiting or bowel movements that are bloody or black. Document Released: 11/06/2003 Document Revised: 05/03/2011 Document Reviewed: 07/09/2009 [facility] Patient Information 2013 [place], [place].  High-Fiber Diet Fiber is found in fruits, vegetables, and grains. A high-fiber diet  encourages the addition of more whole grains, legumes, fruits, and vegetables in your diet. The recommended amount of fiber for adult males is 38 g per day. For adult females, it is 25 g per day. Pregnant and lactating women should get 28 g of fiber per day. If you have a digestive or bowel problem, ask your caregiver for advice before adding high-fiber foods to your diet. Eat a variety of high-fiber foods instead of only a select few type of foods.  PURPOSE  To increase stool bulk.  To make bowel movements more regular to prevent constipation.  To lower cholesterol.  To prevent overeating. WHEN IS THIS DIET USED?  It may be used if you have constipation and hemorrhoids.  It may be used if you have uncomplicated diverticulosis (intestine condition) and irritable bowel syndrome.  It may be used if you need help with weight management.  It may be used if you want to add it to your diet as a protective measure against atherosclerosis, diabetes, and cancer. SOURCES OF FIBER  Whole-grain breads and cereals.  Fruits, such as apples, oranges, bananas, berries, prunes, and pears.  Vegetables, such as green peas, carrots, sweet potatoes, beets, broccoli, cabbage, spinach, and artichokes.  Legumes, such split peas, soy, lentils.  Almonds. FIBER CONTENT IN FOODS Starches and Grains / Dietary Fiber (g)  Cheerios, 1 cup / 3 g  Corn Flakes cereal, 1 cup / 0.7 g  Rice crispy treat cereal, 1 cup / 0.3 g  Instant oatmeal (cooked),  cup / 2 g  Frosted wheat cereal, 1 cup / 5.1 g  Brown, long-grain rice (cooked), 1 cup / 3.5 g  White, long-grain rice (cooked), 1 cup / 0.6 g  Enriched macaroni (cooked), 1 cup / 2.5 g Legumes / Dietary Fiber (g)  Baked  beans (canned, plain, or vegetarian),  cup / 5.2 g  Kidney beans (canned),  cup / 6.8 g  Pinto beans (cooked),  cup / 5.5 g Breads and Crackers / Dietary Fiber (g)  Plain or honey graham crackers, 2 squares / 0.7 g  Saltine  crackers, 3 squares / 0.3 g  Plain, salted pretzels, 10 pieces / 1.8 g  Whole-wheat bread, 1 slice / 1.9 g  White bread, 1 slice / 0.7 g  Raisin bread, 1 slice / 1.2 g  Plain bagel, 3 oz / 2 g  Flour tortilla, 1 oz / 0.9 g  Corn tortilla, 1 small / 1.5 g  Hamburger or hotdog bun, 1 small / 0.9 g Fruits / Dietary Fiber (g)  Apple with skin, 1 medium / 4.4 g  Sweetened applesauce,  cup / 1.5 g  Banana,  medium / 1.5 g  Grapes, 10 grapes / 0.4 g  Orange, 1 small / 2.3 g  Raisin, 1.5 oz / 1.6 g  Melon, 1 cup / 1.4 g Vegetables / Dietary Fiber (g)  Green beans (canned),  cup / 1.3 g  Carrots (cooked),  cup / 2.3 g  Broccoli (cooked),  cup / 2.8 g  Peas (cooked),  cup / 4.4 g  Mashed potatoes,  cup / 1.6 g  Lettuce, 1 cup / 0.5 g  Corn (canned),  cup / 1.6 g  Tomato,  cup / 1.1 g Document Released: 02/08/2005 Document Revised: 08/10/2011 Document Reviewed: 05/13/2011 [facility] Patient Information 2013 [place], [place].   Use Levsin as needed

## 2012-12-05 ENCOUNTER — Telehealth: Payer: Self-pay

## 2012-12-05 NOTE — Telephone Encounter (Addendum)
Medication List and allergies: unable to reach prior to visit    for 90 day supply (mail orde  for local prescriptions  Immunizations due:  A/P:  LAST: HM due: PSA:  11/2011 WNL  CCS: UTD 04/2011 WNL q 5 year

## 2012-12-06 ENCOUNTER — Encounter: Payer: Self-pay | Admitting: Internal Medicine

## 2012-12-06 ENCOUNTER — Ambulatory Visit (INDEPENDENT_AMBULATORY_CARE_PROVIDER_SITE_OTHER): Payer: BC Managed Care – PPO | Admitting: Internal Medicine

## 2012-12-06 VITALS — BP 131/83 | HR 70 | Temp 97.9°F | Ht 68.5 in | Wt 160.0 lb

## 2012-12-06 DIAGNOSIS — Z0000 Encounter for general adult medical examination without abnormal findings: Secondary | ICD-10-CM

## 2012-12-06 DIAGNOSIS — Z23 Encounter for immunization: Secondary | ICD-10-CM

## 2012-12-06 DIAGNOSIS — N3010 Interstitial cystitis (chronic) without hematuria: Secondary | ICD-10-CM

## 2012-12-06 LAB — LDL CHOLESTEROL, DIRECT: Direct LDL: 140.1 mg/dL

## 2012-12-06 LAB — CBC WITH DIFFERENTIAL/PLATELET
Basophils Absolute: 0 10*3/uL (ref 0.0–0.1)
Basophils Relative: 0.1 % (ref 0.0–3.0)
Eosinophils Absolute: 0.1 10*3/uL (ref 0.0–0.7)
Eosinophils Relative: 1 % (ref 0.0–5.0)
HCT: 43.5 % (ref 39.0–52.0)
Hemoglobin: 15 g/dL (ref 13.0–17.0)
Lymphocytes Relative: 12.4 % (ref 12.0–46.0)
Lymphs Abs: 0.7 10*3/uL (ref 0.7–4.0)
MCHC: 34.5 g/dL (ref 30.0–36.0)
MCV: 86.9 fl (ref 78.0–100.0)
Monocytes Absolute: 0.5 10*3/uL (ref 0.1–1.0)
Monocytes Relative: 9.1 % (ref 3.0–12.0)
Neutro Abs: 4.3 10*3/uL (ref 1.4–7.7)
Neutrophils Relative %: 77.4 % — ABNORMAL HIGH (ref 43.0–77.0)
Platelets: 180 10*3/uL (ref 150.0–400.0)
RBC: 5 Mil/uL (ref 4.22–5.81)
RDW: 13.4 % (ref 11.5–14.6)
WBC: 5.6 10*3/uL (ref 4.5–10.5)

## 2012-12-06 LAB — COMPREHENSIVE METABOLIC PANEL
Albumin: 4.5 g/dL (ref 3.5–5.2)
BUN: 17 mg/dL (ref 6–23)
CO2: 26 mEq/L (ref 19–32)
GFR: 80.1 mL/min (ref >60.00)
Glucose, Bld: 93 mg/dL (ref 70–99)
Sodium: 136 mEq/L (ref 135–145)
Total Bilirubin: 0.7 mg/dL (ref 0.3–1.2)
Total Protein: 7.6 g/dL (ref 6.0–8.3)

## 2012-12-06 LAB — RPR

## 2012-12-06 LAB — LIPID PANEL
Cholesterol: 202 mg/dL — ABNORMAL HIGH (ref 0–200)
Triglycerides: 99 mg/dL (ref 0.0–149.0)
VLDL: 19.8 mg/dL (ref 0.0–40.0)

## 2012-12-06 LAB — HIV ANTIBODY (ROUTINE TESTING W REFLEX): HIV: NONREACTIVE

## 2012-12-06 NOTE — Assessment & Plan Note (Signed)
has symptoms sporadically, sees urology's from time to time

## 2012-12-06 NOTE — Assessment & Plan Note (Addendum)
Td  08 Had a  flu shot  colonoscopy 2010, polyp Repeated cscope 04-2011, tics, next in 10 years healthy lifestyle, praised  blood work will do an HIV, VDRL , PSA  per patient request. RTC 1 year

## 2012-12-06 NOTE — Progress Notes (Signed)
  Subjective:    Patient ID: Timothy Medina, male    DOB: 11-14-1960, 52 y.o.   MRN: [id number]  HPI CPX  Past Medical History  Diagnosis Date  . Insomnia   . GERD (gastroesophageal reflux disease)   . Diverticulitis   . Interstitial cystitis   . Personal history of colonic polyps 03/01/2005    adenomatous   . External hemorrhoids without mention of complication   . Family history of malignant neoplasm of gastrointestinal tract   . Insomnia    Past Surgical History  Procedure Laterality Date  . Colonoscopy     History   Social History  . Marital Status: Divorced    Spouse Name: N/A    Number of Children: 0  . Years of Education: N/A   Occupational History  . teacher at [employer]    Social History Main Topics  . Smoking status: Never Smoker   . Smokeless tobacco: Never Used  . Alcohol Use: No  . Drug Use: No  . Sexual Activity: Not on file   Other Topics Concern  . Not on file   Social History Narrative   Has a g-friend   Family History  Problem Relation Age of Onset  . Hypertension Mother   . Diabetes Neg Hx   . Heart disease Neg Hx   . Esophageal cancer Neg Hx   . Stomach cancer Neg Hx   . Colon cancer Other     several fam members  . Alzheimer's disease Other     several fam member   . Prostate cancer Other     ?cousin    Review of Systems Diet-- healthy Exercise-- very active, run, gum No  CP, SOB, lower extremity edema Denies  nausea, vomiting diarrhea Denies  blood in the stools (-) wheezing, chest congestion interstitial cystitis sx 2-3 per year + stress at work but no anxiety, depression Insomnia-- otc benadryl helps       Objective:   Physical Exam BP 131/83  Pulse 70  Temp(Src) 97.9 F (36.6 C)  Ht 5' 8.5" (1.74 m)  Wt 160 lb (72.576 kg)  BMI 23.97 kg/m2  SpO2 96% General -- alert, well-developed, NAD.  Neck --no thyromegaly , normal carotid pulse Lungs -- normal respiratory effort, no intercostal retractions, no  accessory muscle use, and normal breath sounds.  Heart-- normal rate, regular rhythm, no murmur.  Abdomen-- Not distended, good bowel sounds,soft, non-tender.  Rectal-- No external abnormalities noted. Normal sphincter tone. No rectal masses or tenderness. Brown stool  Prostate--Prostate gland firm and smooth, no enlargement, nodularity, tenderness, mass, asymmetry or induration. Extremities-- no pretibial edema bilaterally  Neurologic--  alert & oriented X3. Speech normal, gait normal, strength normal in all extremities.  Psych-- Cognition and judgment appear intact. Cooperative with normal attention span and concentration. No anxious appearing , no depressed appearing.     Assessment & Plan:

## 2012-12-11 ENCOUNTER — Encounter: Payer: Self-pay | Admitting: *Deleted

## 2013-01-22 ENCOUNTER — Telehealth: Payer: Self-pay

## 2013-01-22 ENCOUNTER — Encounter: Payer: Self-pay | Admitting: Physician Assistant

## 2013-01-22 ENCOUNTER — Ambulatory Visit (INDEPENDENT_AMBULATORY_CARE_PROVIDER_SITE_OTHER): Payer: BC Managed Care – PPO | Admitting: Physician Assistant

## 2013-01-22 ENCOUNTER — Telehealth: Payer: Self-pay | Admitting: Gastroenterology

## 2013-01-22 VITALS — BP 100/60 | HR 80 | Ht 67.0 in | Wt 152.2 lb

## 2013-01-22 DIAGNOSIS — R1031 Right lower quadrant pain: Secondary | ICD-10-CM

## 2013-01-22 DIAGNOSIS — R110 Nausea: Secondary | ICD-10-CM

## 2013-01-22 DIAGNOSIS — R634 Abnormal weight loss: Secondary | ICD-10-CM

## 2013-01-22 DIAGNOSIS — R1032 Left lower quadrant pain: Secondary | ICD-10-CM

## 2013-01-22 DIAGNOSIS — G8929 Other chronic pain: Secondary | ICD-10-CM

## 2013-01-22 MED ORDER — ONDANSETRON HCL 4 MG PO TABS: ORAL_TABLET | ORAL | Status: DC

## 2013-01-22 MED ORDER — TRAMADOL HCL 50 MG PO TABS: 50.0000 mg | ORAL_TABLET | Freq: Four times a day (QID) | ORAL | Status: DC | PRN

## 2013-01-22 NOTE — Telephone Encounter (Signed)
The ultram rx that was faxed earlier didn't go thru so I called rx into the [facility] pharmacy and it was verbally given to Aggie Cosier in the pharmacy.

## 2013-01-22 NOTE — Progress Notes (Signed)
Subjective:    Patient ID: Timothy Medina, male    DOB: 1960/11/11, 52 y.o.   MRN: [id number]  HPI   Timothy Medina is a pleasant 52 year old white male known to Dr. Jarold Motto who has history of diverticulosisand prior history of diverticulitis. He was last seen in January of 2014 and felt to be having symptoms consistent with spasm and was given a prescription for Levsin. His last colonoscopy was done in March of 2013 with finding of left-sided diverticulosis and internal hemorrhoids. Patient comes in today with his wife saying is been having lower abdominal pain over the past 4 weeks but has had a significant increase in pain over the past 3 days. He says prior to that the pain was present fairly constantly though the intensity was very and but now has had pain as bad as "10 out of 10 over the past few days. He describes it as a sharp intense pain that comes and goes through the day. This is primarily located in the left lower water and across the lower abdomen. He did have some chills over the weekend but no documented fever. His bowel movements have been fairly normal with no melena or hematochezia. His wife is also concerned as he has had a decrease in appetite over the past several months and has lost about 15 pounds. He has also been having a lot of heartburn and indigestion as well as some nausea. He has tried over-the-counter Prilosec just over the past few days but has not noticed any difference.   Review of Systems  Constitutional: Positive for appetite change, fatigue and unexpected weight change.  HENT: Negative.   Eyes: Negative.   Respiratory: Negative.   Cardiovascular: Negative.   Gastrointestinal: Positive for nausea and abdominal pain.  Endocrine: Negative.   Genitourinary: Positive for frequency.  Musculoskeletal: Negative.   Skin: Negative.   Allergic/Immunologic: Negative.   Neurological: Negative.   Hematological: Negative.   Psychiatric/Behavioral: Negative.     Outpatient Prescriptions Prior to Visit  Medication Sig Dispense Refill  . hyoscyamine (LEVSIN SL) 0.125 MG SL tablet Place 1 tablet (0.125 mg total) under the tongue every 6 (six) hours as needed for cramping.  30 tablet  2   No facility-administered medications prior to visit.       Allergies  Allergen Reactions  . Meperidine Hcl     REACTION: needed Benadryl for hives   Patient Active Problem List   Diagnosis Date Noted  . Annual physical exam 12/03/2011  . Diverticulosis of colon (without mention of hemorrhage) 04/26/2011  . GERD 02/24/2010  . DIVERTICULITIS OF COLON 03/15/2008  . COLONIC POLYPS 11/13/2007  . INSOMNIA-SLEEP DISORDER-UNSPEC 11/13/2007  . HEADACHE 09/16/2007  . INTERSTITIAL CYSTITIS 06/03/2006   History  Substance Use Topics  . Smoking status: Never Smoker   . Smokeless tobacco: Never Used  . Alcohol Use: No   family history includes Alzheimer's disease in his other; Colon cancer in his other; Hypertension in his mother; Prostate cancer in his other. There is no history of Diabetes, Heart disease, Esophageal cancer, or Stomach cancer.  Objective:   Physical Exam  well-developed white male in no acute distress. Blood pressure 100/60 pulse 80 height 5 foot 7 weight 152. HEENT; nontraumatic normocephalic EOMI PERRLA sclera anicteric, Supple no JVD, Cardiovascular; regular rate and rhythm with S1-S2 no murmur or gallop, Pulmonary; clear bilaterally, Abdomen; soft bowel sounds are present she is tender bilaterally in the lower quadrants left greater than right no guarding or rebound  no palpable mass or hepatosplenomegaly, Rectal ;exam not done, Extremities; no clubbing cyanosis or edema skin warm and dry, Psych; mood and affect normal and appropriate       Assessment /plan #1  & Plan:  #39 52 year old male with 4 week history of lower abdominal  pain markedly progressive over the past 3-4 days, and associated with nausea. Rule out diverticulitis versus other  intra-abdominal inflammatory process #2 history of interstitial cystitis #3 anorexia and weight loss #4 GERD  Plan; CBC with differential ,CMET and UA Schedule for CT scan of the abdomen and pelvis within the next 24 hours-will hold on antibiotics until CT reviewed Ultram 50 mg every 4-6 hours as needed for pain Zofran 4 mg every 6 hours when necessary nausea The patient was given samples of Zegerid 40 mg to start every morning-if this is helpful for his indigestion and reflux symptoms will need prescription Patient was scheduled for an office appointment with Dr. Jarold Motto later in the week to discuss possible EGD however would feel prudent to sort out his lower abdominal pain issues first and then can discuss EGD

## 2013-01-22 NOTE — Telephone Encounter (Signed)
Pt states he is having a flare of his diverticulitis. States he is having a lot of pain in his lower abdomen and that he wanted something for pain. Discussed with pt that he may need some antibiotics. Pt scheduled to see Mike Gip PA today at 2:30pm. Pt aware of appt date and time.

## 2013-01-22 NOTE — Patient Instructions (Addendum)
Please go to the basement level to have your labs drawn and a urine test. We sent a prescription for Zofran 4 mg for nausea to your pharmacy, [facility] [address]  We faxed a prescription to your pharmacy for Tramadol ( Ultram ) for pain. Take as directed.   WE have given you samples of Zegerid 40 mg. Take 1 in the Am daily. We sent a prescription to your pharmacy.   You have been scheduled for a CT scan of the abdomen and pelvis at [facility] ([address]---this is in the same building as [facility]).   You are scheduled on 01-23-2013 at 8:30 am . You should arrive at 8:15 am.  prior to your appointment time for registration. Please follow the written instructions below on the day of your exam:  WARNING: IF YOU ARE ALLERGIC TO IODINE/X-RAY DYE, PLEASE NOTIFY RADIOLOGY IMMEDIATELY AT [phone]! YOU WILL BE GIVEN A 13 HOUR PREMEDICATION PREP.  1) Do not eat or drink anything after  4:30  Am  (4 hours prior to your test) 2) You have been given 2 bottles of oral contrast to drink. The solution may taste better if refrigerated, but do NOT add ice or any other liquid to this solution. Shake  well before drinking.    Drink 1 bottle of contrast @ 6:30 am  (2 hours prior to your exam)  Drink 1 bottle of contrast @ 7:30 am  (1 hour prior to your exam)  You may take any medications as prescribed with a small amount of water except for the following: Metformin, Glucophage, Glucovance, Avandamet, Riomet, Fortamet, Actoplus Met, Janumet, Glumetza or Metaglip. The above medications must be held the day of the exam AND 48 hours after the exam.  The purpose of you drinking the oral contrast is to aid in the visualization of your intestinal tract. The contrast solution may cause some diarrhea. Before your exam is started, you will be given a small amount of fluid to drink. Depending on your individual set of symptoms, you may also receive an intravenous injection of  x-ray contrast/dye. Plan on being at [facility] for 30 minutes or long, depending on the type of exam you are having performed.  If you have any questions regarding your exam or if you need to reschedule, you may call the CT department at [phone] between the hours of 8:00 am and 5:00 pm, Monday-Friday.  ________________________________________________________________________

## 2013-01-23 ENCOUNTER — Ambulatory Visit (INDEPENDENT_AMBULATORY_CARE_PROVIDER_SITE_OTHER)
Admission: RE | Admit: 2013-01-23 | Discharge: 2013-01-23 | Disposition: A | Discharge status: Home / Self Care | Payer: BC Managed Care – PPO | Source: Ambulatory Visit | Attending: Physician Assistant | Admitting: Physician Assistant

## 2013-01-23 ENCOUNTER — Other Ambulatory Visit: Payer: Self-pay | Admitting: *Deleted

## 2013-01-23 ENCOUNTER — Other Ambulatory Visit (INDEPENDENT_AMBULATORY_CARE_PROVIDER_SITE_OTHER): Payer: BC Managed Care – PPO

## 2013-01-23 DIAGNOSIS — G8929 Other chronic pain: Secondary | ICD-10-CM

## 2013-01-23 DIAGNOSIS — R1031 Right lower quadrant pain: Secondary | ICD-10-CM

## 2013-01-23 DIAGNOSIS — R110 Nausea: Secondary | ICD-10-CM

## 2013-01-23 DIAGNOSIS — R1032 Left lower quadrant pain: Secondary | ICD-10-CM

## 2013-01-23 DIAGNOSIS — R634 Abnormal weight loss: Secondary | ICD-10-CM

## 2013-01-23 LAB — CBC WITH DIFFERENTIAL/PLATELET
Basophils Absolute: 0 10*3/uL (ref 0.0–0.1)
Eosinophils Absolute: 0.1 10*3/uL (ref 0.0–0.7)
HCT: 40.5 % (ref 39.0–52.0)
Lymphs Abs: 0.8 10*3/uL (ref 0.7–4.0)
MCHC: 33.8 g/dL (ref 30.0–36.0)
Monocytes Absolute: 0.7 10*3/uL (ref 0.1–1.0)
Neutro Abs: 5.2 10*3/uL (ref 1.4–7.7)
Neutrophils Relative %: 76 % (ref 43.0–77.0)
Platelets: 185 10*3/uL (ref 150.0–400.0)
RBC: 4.64 Mil/uL (ref 4.22–5.81)
WBC: 6.8 10*3/uL (ref 4.5–10.5)

## 2013-01-23 LAB — COMPREHENSIVE METABOLIC PANEL
ALT: 32 U/L (ref 0–53)
AST: 20 U/L (ref 0–37)
Alkaline Phosphatase: 59 U/L (ref 39–117)
Creatinine, Ser: 1.1 mg/dL (ref 0.4–1.5)
GFR: 75.84 mL/min (ref >60.00)
Glucose, Bld: 104 mg/dL — ABNORMAL HIGH (ref 70–99)
Sodium: 134 mEq/L — ABNORMAL LOW (ref 135–145)
Total Bilirubin: 0.5 mg/dL (ref 0.3–1.2)
Total Protein: 7.2 g/dL (ref 6.0–8.3)

## 2013-01-23 LAB — URINALYSIS
Ketones, ur: NEGATIVE
Leukocytes, UA: NEGATIVE
Nitrite: NEGATIVE
Specific Gravity, Urine: <=1.005 (ref 1.000–1.030)
Urobilinogen, UA: 0.2 (ref 0.0–1.0)
pH: 6 (ref 5.0–8.0)

## 2013-01-23 MED ORDER — SACCHAROMYCES BOULARDII 250 MG PO CAPS: ORAL_CAPSULE | ORAL | Status: DC

## 2013-01-23 MED ORDER — AMOXICILLIN-POT CLAVULANATE 875-125 MG PO TABS: ORAL_TABLET | ORAL | Status: DC

## 2013-01-23 MED ORDER — IOHEXOL 300 MG/ML  SOLN
100.0000 mL | Freq: Once | INTRAMUSCULAR | Status: AC | PRN
  Administered 2013-01-23: 100 mL via INTRAVENOUS

## 2013-01-25 ENCOUNTER — Ambulatory Visit: Payer: BC Managed Care – PPO | Admitting: Gastroenterology

## 2013-02-12 ENCOUNTER — Encounter: Payer: Self-pay | Admitting: Physician Assistant

## 2013-02-12 ENCOUNTER — Encounter: Payer: Self-pay | Admitting: Gastroenterology

## 2013-02-12 ENCOUNTER — Ambulatory Visit (INDEPENDENT_AMBULATORY_CARE_PROVIDER_SITE_OTHER): Payer: BC Managed Care – PPO | Admitting: Physician Assistant

## 2013-02-12 VITALS — BP 104/74 | HR 80 | Ht 67.25 in | Wt 155.2 lb

## 2013-02-12 DIAGNOSIS — K219 Gastro-esophageal reflux disease without esophagitis: Secondary | ICD-10-CM

## 2013-02-12 DIAGNOSIS — K5732 Diverticulitis of large intestine without perforation or abscess without bleeding: Secondary | ICD-10-CM

## 2013-02-12 DIAGNOSIS — R109 Unspecified abdominal pain: Secondary | ICD-10-CM

## 2013-02-12 MED ORDER — AMOXICILLIN-POT CLAVULANATE 875-125 MG PO TABS: 1.0000 | ORAL_TABLET | Freq: Two times a day (BID) | ORAL | Status: DC

## 2013-02-12 NOTE — Progress Notes (Signed)
Subjective:    Patient ID: Timothy Medina, male    DOB: [birth year], 52 y.o.   MRN: [id number]  HPI Mose  is a very nice 52 year old white male who was seen in the office on December 1 with complaints of lower Donald pain over the previous one month. It had an upper abrupt increase in his pain for 3 days prior to that visit. He had also had a decrease in his appetite and weight loss of about 15 pounds over the past several months. He did complain of a lot of heartburn and indigestion no dysphagia. Clinically it was felt that he had diverticulitis and CT scan of the abdomen and pelvis was ordered which showed a moderate diverticulitis involving the proximal sigmoid colon no abscess or free fluid and otherwise negative study. He was treated with a 14 day course of Augmentin and also given floor store. Also started him on Zegerid 40 mg by mouth daily for GERD symptoms. He comes back in today stating that he definitely felt better while taking the Augmentin though he's not certain that his symptoms completely resolved. Now off of antibiotics over the past week he feels that his pain is gradually recurring. He tried taking some Ultram over the weekend which he did not find helpful but did try higher dose Tylenol and that was effective. He has not had any fever or chills. No diarrhea melena or hematochezia. He states that his reflux symptoms are much better and has been taking the Zegerid. His appetite had been improved though declined again over the weekend with recurrence of pain.     Review of Systems  Constitutional: Positive for appetite change.  HENT: Negative.   Eyes: Negative.   Respiratory: Negative.   Cardiovascular: Negative.   Gastrointestinal: Positive for abdominal pain.  Endocrine: Negative.   Genitourinary: Negative.   Musculoskeletal: Positive for back pain.  Allergic/Immunologic: Negative.   Neurological: Negative.   Hematological: Negative.   Psychiatric/Behavioral:  Negative.    Outpatient Prescriptions Prior to Visit  Medication Sig Dispense Refill  . saccharomyces boulardii (FLORASTOR) 250 MG capsule Take one po daily x 3 weeks  21 capsule  0  . amoxicillin-clavulanate (AUGMENTIN) 875-125 MG per tablet Take one po twice daily x 2 weeks  28 tablet  0  . hyoscyamine (LEVSIN SL) 0.125 MG SL tablet Place 1 tablet (0.125 mg total) under the tongue every 6 (six) hours as needed for cramping.  30 tablet  2  . ondansetron (ZOFRAN) 4 MG tablet Take 1 tab every 6 hours as needed for nausea  20 tablet  0  . traMADol (ULTRAM) 50 MG tablet Take 1 tablet (50 mg total) by mouth every 6 (six) hours as needed.  40 tablet  0   No facility-administered medications prior to visit.   Allergies  Allergen Reactions  . Meperidine Hcl     REACTION: needed Benadryl for hives   Patient Active Problem List   Diagnosis Date Noted  . Annual physical exam 12/03/2011  . Diverticulosis of colon (without mention of hemorrhage) 04/26/2011  . GERD 02/24/2010  . DIVERTICULITIS OF COLON 03/15/2008  . COLONIC POLYPS 11/13/2007  . INSOMNIA-SLEEP DISORDER-UNSPEC 11/13/2007  . HEADACHE 09/16/2007  . INTERSTITIAL CYSTITIS 06/03/2006   History  Substance Use Topics  . Smoking status: Never Smoker   . Smokeless tobacco: Never Used  . Alcohol Use: No   family history includes Alzheimer's disease in his other; Colon cancer in his other; Hypertension in his mother; Prostate  cancer in his other. There is no history of Diabetes, Heart disease, Esophageal cancer, or Stomach cancer. History  Substance Use Topics  . Smoking status: Never Smoker   . Smokeless tobacco: Never Used  . Alcohol Use: No       Objective:   Physical Exam Well-developed white male in no acute distress, pleasant blood pressure 104/74 pulse 80 height 5 foot 7 weight 155. HEENT; nontraumatic normocephalic EOMI PERRLA sclera anicteric, Supple; no JVD, Cardiovascular; regular rate and rhythm with S1-S2 no murmur or  gallop, Pulm;r clear bilaterally, Abdomen; soft he has tenderness in the left lower quadrant there is no guarding or rebound no palpable mass or hepatosplenomegaly bowel sounds are present, Rectal ;exam not done, Extremities; no clubbing cyanosis or edema skin warm and dry, Psych; mood and affect appropriate       Assessment & Plan:  #32   52 year old male with acute diverticulitis -not completely resolved after a two-week course of Augmentin with mild increase in pain over the past few days. CT done earlier this month showed an uncomplicated proximal sigmoid diverticulitis #2 chronic GERD-improved on Zegerid #3 anorexia and weight loss resolved #4 interstitial cystitis  Plan; Will treat with at least another 10 days of Augmentin 875 mg by mouth twice daily. Patient is asked to call the office if his symptoms are not completely resolved when he finishes the antibiotics. Continue Florastor  once daily over the next 2-3 weeks Tylenol as needed for pain Continue Zegerid 40 mg by mouth every morning Schedule for upper endoscopy with Dr. Jarold Motto in [place] patient has had chronic GERD symptoms and should be screened for Barrett's.

## 2013-02-12 NOTE — Patient Instructions (Signed)
You have been scheduled for an endoscopy with propofol. Please follow written instructions given to you at your visit today. If you use inhalers (even only as needed), please bring them with you on the day of your procedure. Your physician has requested that you go to www.startemmi.com and enter the access code given to you at your visit today. This web site gives a general overview about your procedure. However, you should still follow specific instructions given to you by our office regarding your preparation for the procedure.  We have sent in your prescriptions to your pharmacy Continue Florastor and Zegerid

## 2013-02-26 ENCOUNTER — Encounter: Payer: BC Managed Care – PPO | Admitting: Gastroenterology

## 2013-03-13 ENCOUNTER — Telehealth: Payer: Self-pay | Admitting: Gastroenterology

## 2013-03-13 NOTE — Telephone Encounter (Signed)
Pt with Hx of Diverticulitis and LUQ pain with a flare in December, 2014. He took Augmentin and was no better, so he saw Nicoletta Ba, PA again on 02/12/13 and he wa given 2 more weeks of Augmentin with Florastor. He completed the AB and states he felt great for 2 weeks. Yesterday the pain returned and is severe. He denies a fever or diarrhea, just the pain. On 02/12/13, Amy ordered an ECL which pt cancelled. Please advise; you can see him on Friday at 08:30am. Thanks.

## 2013-03-13 NOTE — Telephone Encounter (Signed)
ok 

## 2013-03-15 ENCOUNTER — Telehealth: Payer: Self-pay | Admitting: Gastroenterology

## 2013-03-15 ENCOUNTER — Inpatient Hospital Stay (HOSPITAL_COMMUNITY)
Admission: EM | Admit: 2013-03-15 | Discharge: 2013-03-19 | DRG: 392 | Disposition: A | Discharge status: Home / Self Care | Payer: BC Managed Care – PPO | Attending: Internal Medicine | Admitting: Internal Medicine

## 2013-03-15 ENCOUNTER — Encounter (HOSPITAL_COMMUNITY): Payer: Self-pay | Admitting: Emergency Medicine

## 2013-03-15 ENCOUNTER — Emergency Department (HOSPITAL_COMMUNITY): Payer: BC Managed Care – PPO

## 2013-03-15 DIAGNOSIS — Z886 Allergy status to analgesic agent status: Secondary | ICD-10-CM

## 2013-03-15 DIAGNOSIS — Z860100 Personal history of colon polyps, unspecified: Secondary | ICD-10-CM

## 2013-03-15 DIAGNOSIS — K5780 Diverticulitis of intestine, part unspecified, with perforation and abscess without bleeding: Secondary | ICD-10-CM

## 2013-03-15 DIAGNOSIS — N3010 Interstitial cystitis (chronic) without hematuria: Secondary | ICD-10-CM

## 2013-03-15 DIAGNOSIS — K219 Gastro-esophageal reflux disease without esophagitis: Secondary | ICD-10-CM

## 2013-03-15 DIAGNOSIS — G4700 Insomnia, unspecified: Secondary | ICD-10-CM

## 2013-03-15 DIAGNOSIS — K5900 Constipation, unspecified: Secondary | ICD-10-CM | POA: Diagnosis present

## 2013-03-15 DIAGNOSIS — D72829 Elevated white blood cell count, unspecified: Secondary | ICD-10-CM | POA: Diagnosis present

## 2013-03-15 DIAGNOSIS — K5792 Diverticulitis of intestine, part unspecified, without perforation or abscess without bleeding: Secondary | ICD-10-CM

## 2013-03-15 DIAGNOSIS — R630 Anorexia: Secondary | ICD-10-CM | POA: Diagnosis present

## 2013-03-15 DIAGNOSIS — K5732 Diverticulitis of large intestine without perforation or abscess without bleeding: Secondary | ICD-10-CM

## 2013-03-15 DIAGNOSIS — Z79899 Other long term (current) drug therapy: Secondary | ICD-10-CM

## 2013-03-15 DIAGNOSIS — R51 Headache: Secondary | ICD-10-CM

## 2013-03-15 DIAGNOSIS — Z800 Family history of malignant neoplasm of digestive organs: Secondary | ICD-10-CM

## 2013-03-15 DIAGNOSIS — K630 Abscess of intestine: Secondary | ICD-10-CM | POA: Diagnosis present

## 2013-03-15 DIAGNOSIS — R634 Abnormal weight loss: Secondary | ICD-10-CM | POA: Diagnosis present

## 2013-03-15 LAB — COMPREHENSIVE METABOLIC PANEL
ALT: 26 U/L (ref 0–53)
AST: 20 U/L (ref 0–37)
Albumin: 4 g/dL (ref 3.5–5.2)
Alkaline Phosphatase: 82 U/L (ref 39–117)
BUN: 18 mg/dL (ref 6–23)
CALCIUM: 9.1 mg/dL (ref 8.4–10.5)
CO2: 21 mEq/L (ref 19–32)
CREATININE: 0.98 mg/dL (ref 0.50–1.35)
Chloride: 97 mEq/L (ref 96–112)
GFR calc non Af Amer: >90 mL/min (ref >90)
GLUCOSE: 96 mg/dL (ref 70–99)
Potassium: 4.1 mEq/L (ref 3.7–5.3)
Sodium: 134 mEq/L — ABNORMAL LOW (ref 137–147)
TOTAL PROTEIN: 8.2 g/dL (ref 6.0–8.3)
Total Bilirubin: 0.6 mg/dL (ref 0.3–1.2)

## 2013-03-15 LAB — URINE MICROSCOPIC-ADD ON

## 2013-03-15 LAB — URINALYSIS, ROUTINE W REFLEX MICROSCOPIC
Bilirubin Urine: NEGATIVE
Glucose, UA: NEGATIVE mg/dL
Hgb urine dipstick: NEGATIVE
Ketones, ur: >80 mg/dL — AB
Leukocytes, UA: NEGATIVE
NITRITE: NEGATIVE
PROTEIN: 30 mg/dL — AB
Specific Gravity, Urine: 1.026 (ref 1.005–1.030)
UROBILINOGEN UA: 0.2 mg/dL (ref 0.0–1.0)
pH: 8 (ref 5.0–8.0)

## 2013-03-15 LAB — CBC WITH DIFFERENTIAL/PLATELET
Basophils Absolute: 0 10*3/uL (ref 0.0–0.1)
Basophils Relative: 0 % (ref 0–1)
EOS ABS: 0 10*3/uL (ref 0.0–0.7)
EOS PCT: 0 % (ref 0–5)
HCT: 41.8 % (ref 39.0–52.0)
HEMOGLOBIN: 14.6 g/dL (ref 13.0–17.0)
LYMPHS ABS: 0.7 10*3/uL (ref 0.7–4.0)
Lymphocytes Relative: 6 % — ABNORMAL LOW (ref 12–46)
MCH: 30.1 pg (ref 26.0–34.0)
MCHC: 34.9 g/dL (ref 30.0–36.0)
MCV: 86.2 fL (ref 78.0–100.0)
MONO ABS: 1.4 10*3/uL — AB (ref 0.1–1.0)
MONOS PCT: 12 % (ref 3–12)
Neutro Abs: 9.4 10*3/uL — ABNORMAL HIGH (ref 1.7–7.7)
Neutrophils Relative %: 82 % — ABNORMAL HIGH (ref 43–77)
PLATELETS: 171 10*3/uL (ref 150–400)
RBC: 4.85 MIL/uL (ref 4.22–5.81)
RDW: 13.5 % (ref 11.5–15.5)
WBC: 11.5 10*3/uL — ABNORMAL HIGH (ref 4.0–10.5)

## 2013-03-15 LAB — LIPASE, BLOOD: LIPASE: 29 U/L (ref 11–59)

## 2013-03-15 MED ORDER — PANTOPRAZOLE SODIUM 40 MG IV SOLR
40.0000 mg | Freq: Two times a day (BID) | INTRAVENOUS | Status: DC
  Administered 2013-03-16 – 2013-03-18 (×7): 40 mg via INTRAVENOUS
  Filled 2013-03-15 (×9): qty 40

## 2013-03-15 MED ORDER — ACETAMINOPHEN 500 MG PO TABS
1000.0000 mg | ORAL_TABLET | Freq: Once | ORAL | Status: AC
  Administered 2013-03-15: 1000 mg via ORAL
  Filled 2013-03-15: qty 2

## 2013-03-15 MED ORDER — METRONIDAZOLE IN NACL 5-0.79 MG/ML-% IV SOLN
500.0000 mg | Freq: Three times a day (TID) | INTRAVENOUS | Status: DC
  Administered 2013-03-16 – 2013-03-19 (×10): 500 mg via INTRAVENOUS
  Filled 2013-03-15 (×12): qty 100

## 2013-03-15 MED ORDER — MORPHINE SULFATE 4 MG/ML IJ SOLN
4.0000 mg | Freq: Once | INTRAMUSCULAR | Status: AC
  Administered 2013-03-15: 4 mg via INTRAVENOUS
  Filled 2013-03-15: qty 1

## 2013-03-15 MED ORDER — ONDANSETRON HCL 4 MG PO TABS
4.0000 mg | ORAL_TABLET | Freq: Four times a day (QID) | ORAL | Status: DC | PRN
  Filled 2013-03-15 (×2): qty 1

## 2013-03-15 MED ORDER — METRONIDAZOLE IN NACL 5-0.79 MG/ML-% IV SOLN
500.0000 mg | Freq: Once | INTRAVENOUS | Status: AC
  Administered 2013-03-15: 500 mg via INTRAVENOUS
  Filled 2013-03-15: qty 100

## 2013-03-15 MED ORDER — ONDANSETRON HCL 4 MG/2ML IJ SOLN
4.0000 mg | Freq: Four times a day (QID) | INTRAMUSCULAR | Status: DC | PRN
  Administered 2013-03-16 – 2013-03-17 (×4): 4 mg via INTRAVENOUS
  Filled 2013-03-15 (×4): qty 2

## 2013-03-15 MED ORDER — ONDANSETRON HCL 4 MG/2ML IJ SOLN
4.0000 mg | Freq: Once | INTRAMUSCULAR | Status: AC
  Administered 2013-03-15: 4 mg via INTRAVENOUS
  Filled 2013-03-15: qty 2

## 2013-03-15 MED ORDER — SODIUM CHLORIDE 0.9 % IV SOLN
INTRAVENOUS | Status: DC
  Administered 2013-03-15 – 2013-03-18 (×6): via INTRAVENOUS

## 2013-03-15 MED ORDER — ONDANSETRON 8 MG PO TBDP
8.0000 mg | ORAL_TABLET | Freq: Once | ORAL | Status: AC
  Administered 2013-03-15: 8 mg via ORAL
  Filled 2013-03-15: qty 1

## 2013-03-15 MED ORDER — CIPROFLOXACIN IN D5W 400 MG/200ML IV SOLN
400.0000 mg | Freq: Two times a day (BID) | INTRAVENOUS | Status: DC
  Administered 2013-03-16 – 2013-03-18 (×6): 400 mg via INTRAVENOUS
  Filled 2013-03-15 (×8): qty 200

## 2013-03-15 MED ORDER — IOHEXOL 300 MG/ML  SOLN
50.0000 mL | Freq: Once | INTRAMUSCULAR | Status: AC | PRN
  Administered 2013-03-15: 50 mL via ORAL

## 2013-03-15 MED ORDER — CIPROFLOXACIN IN D5W 400 MG/200ML IV SOLN
400.0000 mg | Freq: Once | INTRAVENOUS | Status: AC
  Administered 2013-03-15: 400 mg via INTRAVENOUS
  Filled 2013-03-15: qty 200

## 2013-03-15 MED ORDER — ENOXAPARIN SODIUM 40 MG/0.4ML ~~LOC~~ SOLN
40.0000 mg | Freq: Every day | SUBCUTANEOUS | Status: DC
  Administered 2013-03-16 – 2013-03-18 (×4): 40 mg via SUBCUTANEOUS
  Filled 2013-03-15 (×5): qty 0.4

## 2013-03-15 MED ORDER — MORPHINE SULFATE 2 MG/ML IJ SOLN
1.0000 mg | INTRAMUSCULAR | Status: DC | PRN
  Administered 2013-03-16 – 2013-03-17 (×10): 2 mg via INTRAVENOUS
  Filled 2013-03-15 (×11): qty 1

## 2013-03-15 MED ORDER — IOHEXOL 300 MG/ML  SOLN
100.0000 mL | Freq: Once | INTRAMUSCULAR | Status: AC | PRN
  Administered 2013-03-15: 100 mL via INTRAVENOUS

## 2013-03-15 NOTE — Consult Note (Signed)
Reason for Consult:Diverticulitis Referring Physician:  Dr. Broadus [name] Ziegler is an 53 y.o. male.  HPI: Timothy Medina has a history of sigmoid diverticulitis. Timothy Medina had a bout in December and January. Timothy Medina was treated with Augmentin in January and felt better.  Timothy Medina developed severe left lower quadrant pain 3 days ago. Timothy Medina induct coming to the emergency department. Timothy Medina had a mild leukocytosis. CT scan demonstratedSigmoid diverticulitis with a possible abscess forming adjacent to the sigmoid colon. It was less than 3 cm in size. Timothy Medina was admitted to the hospital and we were asked to see him.  Timothy Medina has been started on IV Cipro and Flagyl. Timothy Medina has taken oral Cipro and Flagyl in the past with good results. However this caused him some problems with taste and upset stomach. His last 2 bouts have been treated with Augmentin. Timothy Medina has had multiple bouts of diverticulitis in the past. Timothy Medina states his last colonoscopy was 2 years ago.    Timothy Medina has been seen in our office previously and elective surgery was discussed with him but Timothy Medina decided not to pursue it at that time.  Past Medical History  Diagnosis Date  . Insomnia   . GERD (gastroesophageal reflux disease)   . Diverticulitis   . Interstitial cystitis   . Personal history of colonic polyps 03/01/2005    adenomatous   . External hemorrhoids without mention of complication   . Family history of malignant neoplasm of gastrointestinal tract     Past Surgical History  Procedure Laterality Date  . Colonoscopy      Family History  Problem Relation Age of Onset  . Hypertension Mother   . Diabetes Neg Hx   . Heart disease Neg Hx   . Esophageal cancer Neg Hx   . Stomach cancer Neg Hx   . Colon cancer Other     several fam members-pat side  . Alzheimer's disease Other     several fam member   . Prostate cancer Other     ?cousin    Social History:  reports that Timothy Medina has never smoked. Timothy Medina has never used smokeless tobacco. Timothy Medina reports that Timothy Medina does not drink alcohol or use  illicit drugs.  Allergies:  Allergies  Allergen Reactions  . Meperidine Hcl     REACTION: needed Benadryl for hives    Prior to Admission medications   Medication Sig Start Date End Date Taking? Authorizing Provider  ondansetron (ZOFRAN) 8 MG tablet Take 8 mg by mouth every 8 (eight) hours as needed for nausea or vomiting.   Yes Historical Provider, MD  traMADol (ULTRAM) 50 MG tablet Take 50 mg by mouth once.   Yes Historical Provider, MD      Results for orders placed during the hospital encounter of 03/15/13 (from the past 48 hour(s))  CBC WITH DIFFERENTIAL     Status: Abnormal   Collection Time    03/15/13  4:17 PM      Result Value Range   WBC 11.5 (*) 4.0 - 10.5 K/uL   RBC 4.85  4.22 - 5.81 MIL/uL   Hemoglobin 14.6  13.0 - 17.0 g/dL   HCT 41.8  39.0 - 52.0 %   MCV 86.2  78.0 - 100.0 fL   MCH 30.1  26.0 - 34.0 pg   MCHC 34.9  30.0 - 36.0 g/dL   RDW 13.5  11.5 - 15.5 %   Platelets 171  150 - 400 K/uL   Neutrophils Relative % 82 (*) 43 - 77 %  Neutro Abs 9.4 (*) 1.7 - 7.7 K/uL   Lymphocytes Relative 6 (*) 12 - 46 %   Lymphs Abs 0.7  0.7 - 4.0 K/uL   Monocytes Relative 12  3 - 12 %   Monocytes Absolute 1.4 (*) 0.1 - 1.0 K/uL   Eosinophils Relative 0  0 - 5 %   Eosinophils Absolute 0.0  0.0 - 0.7 K/uL   Basophils Relative 0  0 - 1 %   Basophils Absolute 0.0  0.0 - 0.1 K/uL  COMPREHENSIVE METABOLIC PANEL     Status: Abnormal   Collection Time    03/15/13  4:17 PM      Result Value Range   Sodium 134 (*) 137 - 147 mEq/L   Potassium 4.1  3.7 - 5.3 mEq/L   Chloride 97  96 - 112 mEq/L   CO2 21  19 - 32 mEq/L   Glucose, Bld 96  70 - 99 mg/dL   BUN 18  6 - 23 mg/dL   Creatinine, Ser 0.98  0.50 - 1.35 mg/dL   Calcium 9.1  8.4 - 10.5 mg/dL   Total Protein 8.2  6.0 - 8.3 g/dL   Albumin 4.0  3.5 - 5.2 g/dL   AST 20  0 - 37 U/L   ALT 26  0 - 53 U/L   Alkaline Phosphatase 82  39 - 117 U/L   Total Bilirubin 0.6  0.3 - 1.2 mg/dL   GFR calc non Af Amer >90  >90 mL/min    GFR calc Af Amer >90  >90 mL/min   Comment: (NOTE)     The eGFR has been calculated using the CKD EPI equation.     This calculation has not been validated in all clinical situations.     eGFR's persistently <90 mL/min signify possible Chronic Kidney     Disease.  LIPASE, BLOOD     Status: None   Collection Time    03/15/13  4:17 PM      Result Value Range   Lipase 29  11 - 59 U/L  URINALYSIS, ROUTINE W REFLEX MICROSCOPIC     Status: Abnormal   Collection Time    03/15/13  5:58 PM      Result Value Range   Color, Urine AMBER (*) YELLOW   Comment: BIOCHEMICALS MAY BE AFFECTED BY COLOR   APPearance CLEAR  CLEAR   Specific Gravity, Urine 1.026  1.005 - 1.030   pH 8.0  5.0 - 8.0   Glucose, UA NEGATIVE  NEGATIVE mg/dL   Hgb urine dipstick NEGATIVE  NEGATIVE   Bilirubin Urine NEGATIVE  NEGATIVE   Ketones, ur >80 (*) NEGATIVE mg/dL   Protein, ur 30 (*) NEGATIVE mg/dL   Urobilinogen, UA 0.2  0.0 - 1.0 mg/dL   Nitrite NEGATIVE  NEGATIVE   Leukocytes, UA NEGATIVE  NEGATIVE  URINE MICROSCOPIC-ADD ON     Status: None   Collection Time    03/15/13  5:58 PM      Result Value Range   Squamous Epithelial / LPF RARE  RARE   RBC / HPF 3-6  <3 RBC/hpf   Bacteria, UA RARE  RARE    Ct Abdomen Pelvis W Contrast  03/15/2013   ADDENDUM REPORT: 03/15/2013 20:14  ADDENDUM: Not mentioned in the impressions is a borderline enlarged low retroperitoneal node. Favored to be reactive.   Electronically Signed   By: Abigail Miyamoto M.D.   On: 03/15/2013 20:14   03/15/2013   CLINICAL DATA:  Lower pelvic pain and history of diverticulitis.  EXAM: CT ABDOMEN AND PELVIS WITH CONTRAST  TECHNIQUE: Multidetector CT imaging of the abdomen and pelvis was performed using the standard protocol following bolus administration of intravenous contrast.  CONTRAST:  134m OMNIPAQUE IOHEXOL 300 MG/ML  SOLN  COMPARISON:  CT ABD/PELVIS W CM dated 01/23/2013; CT PELVIS W/CM dated 01/20/2008; CT PELVIS W/CM dated 10/25/2008  FINDINGS:  Lower Chest: Clear lung bases. Normal heart size without pericardial or pleural effusion.  Abdomen/Pelvis: Normal liver, spleen, stomach, pancreas, gallbladder, biliary tract, adrenal glands, kidneys. A left periaortic node which measures 1.0 cm on image 33 and is unchanged. This node measures 7 mm back on 10/25/2008.  Marked wall thickening involves the sigmoid, in the region of diverticula. Mild surrounding edema. No free perforation. Either within or immediately adjacent to the anterior superior wall of the sigmoid is a developing fluid and gas collection, which measures 2.4 cm on coronal image 40. The extent of wall thickening is similar to on the prior exam.  Diverticula identified throughout the remainder of the colon. Normal terminal ileum and appendix. Normal small bowel without abdominal ascites. No pelvic adenopathy. Normal urinary bladder and prostate. No significant free fluid.  Bones/Musculoskeletal:  No acute osseous abnormality.  IMPRESSION: 1. Residual or recurrent sigmoid diverticulitis. Suspect a developing abscess either within or immediately adjacent to the sigmoid. This is likely not percutaneously drainable, given its size and position. 2. Given the extent of focal colonic wall thickening, consider clinical and possibly colonoscopy follow-up to exclude underlying neoplasm.  Electronically Signed: By: KAbigail MiyamotoM.D. On: 03/15/2013 19:48    Review of Systems  Constitutional: Positive for fever and weight loss. Negative for chills.  Respiratory: Negative for shortness of breath.   Cardiovascular: Negative for chest pain.  Gastrointestinal: Positive for nausea, vomiting and abdominal pain. Negative for diarrhea and blood in stool.  Genitourinary: Negative for dysuria and hematuria.   Blood pressure 119/69, pulse 78, temperature 98.7 F (37.1 C), temperature source Oral, resp. rate 16, SpO2 98.00%. Physical Exam  Constitutional: Timothy Medina appears well-developed and well-nourished. No  distress.  HENT:  Head: Normocephalic and atraumatic.  Cardiovascular: Normal rate and regular rhythm.   Respiratory: Effort normal and breath sounds normal.  GI: Soft. Timothy Medina exhibits no distension and no mass. There is tenderness (In left lower quadrant). There is no guarding.  Musculoskeletal: Timothy Medina exhibits no edema.  Neurological: Timothy Medina is alert.  Skin: Skin is warm and dry.    Assessment/Plan: Recurrent sigmoid diverticulitis with possible evolving abscess  Recommendation: IV antibiotics and bowel rest. Colonoscopy 6 weeks after this episode. Repeat CT scan this admission if Timothy Medina fails to improve. Given the recurrent episodes, it was suggested to him that Timothy Medina reconsider elective partial colectomy.  Gaylin Osoria J 03/15/2013, 11:42 PM

## 2013-03-15 NOTE — ED Notes (Signed)
Patient transported to CT 

## 2013-03-15 NOTE — ED Provider Notes (Signed)
CSN: [id number]     Arrival date & time 03/15/13  1508 History   First MD Initiated Contact with Patient 03/15/13 1743     Chief Complaint  Patient presents with  . Abdominal Pain   (Consider location/radiation/quality/duration/timing/severity/associated sxs/prior Treatment) HPI Comments: Patient presents emergency department with chief complaint of left lower quadrant pain. He states the pain began on Monday. He has had this pain before. He states that he has had several episodes of diverticulitis. He is followed by Dr. Sharlett Iles from gastroenterology, who was unable to see the patient today, and sent him to the emergency department. She reports mild fever, but denies any chills. He states that he had one episode of vomiting today, which she associates with being in pain. States it has been constipated for the past couple of days, but had a fleets enema today with good relief. He denies seeing any melena. Currently, he states his pain is 2/10. He states that the pain was severe, and he states that he was doubled over cramping earlier, but now feels better.  The history is provided by the patient. No language interpreter was used.    Past Medical History  Diagnosis Date  . Insomnia   . GERD (gastroesophageal reflux disease)   . Diverticulitis   . Interstitial cystitis   . Personal history of colonic polyps 03/01/2005    adenomatous   . External hemorrhoids without mention of complication   . Family history of malignant neoplasm of gastrointestinal tract    Past Surgical History  Procedure Laterality Date  . Colonoscopy     Family History  Problem Relation Age of Onset  . Hypertension Mother   . Diabetes Neg Hx   . Heart disease Neg Hx   . Esophageal cancer Neg Hx   . Stomach cancer Neg Hx   . Colon cancer Other     several fam members-pat side  . Alzheimer's disease Other     several fam member   . Prostate cancer Other     ?cousin   History  Substance Use Topics  . Smoking  status: Never Smoker   . Smokeless tobacco: Never Used  . Alcohol Use: No    Review of Systems  All other systems reviewed and are negative.    Allergies  Meperidine hcl  Home Medications   Current Outpatient Rx  Name  Route  Sig  Dispense  Refill  . ondansetron (ZOFRAN) 8 MG tablet   Oral   Take 8 mg by mouth every 8 (eight) hours as needed for nausea or vomiting.         . traMADol (ULTRAM) 50 MG tablet   Oral   Take 50 mg by mouth once.          BP 125/79  Pulse 80  Temp(Src) 100.3 F (37.9 C) (Oral)  Resp 20  SpO2 98% Physical Exam  Nursing note and vitals reviewed. Constitutional: He is oriented to person, place, and time. He appears well-developed and well-nourished.  HENT:  Head: Normocephalic and atraumatic.  Eyes: Conjunctivae and EOM are normal. Pupils are equal, round, and reactive to light. Right eye exhibits no discharge. Left eye exhibits no discharge. No scleral icterus.  Neck: Normal range of motion. Neck supple. No JVD present.  Cardiovascular: Normal rate, regular rhythm, normal heart sounds and intact distal pulses.  Exam reveals no gallop and no friction rub.   No murmur heard. Pulmonary/Chest: Effort normal and breath sounds normal. No respiratory distress. He has  no wheezes. He has no rales. He exhibits no tenderness.  Abdominal: Soft. Bowel sounds are normal. He exhibits no distension and no mass. There is tenderness. There is no rebound and no guarding.  Left lower quadrant is very tender to palpation, no fluid, or signs of peritonitis, no other focal abdominal tenderness  Musculoskeletal: Normal range of motion. He exhibits no edema and no tenderness.  Neurological: He is alert and oriented to person, place, and time.  Skin: Skin is warm and dry.  Psychiatric: He has a normal mood and affect. His behavior is normal. Judgment and thought content normal.    ED Course  Procedures (including critical care time) Results for orders placed  during the hospital encounter of 03/15/13  CBC WITH DIFFERENTIAL      Result Value Range   WBC 11.5 (*) 4.0 - 10.5 K/uL   RBC 4.85  4.22 - 5.81 MIL/uL   Hemoglobin 14.6  13.0 - 17.0 g/dL   HCT 41.8  39.0 - 52.0 %   MCV 86.2  78.0 - 100.0 fL   MCH 30.1  26.0 - 34.0 pg   MCHC 34.9  30.0 - 36.0 g/dL   RDW 13.5  11.5 - 15.5 %   Platelets 171  150 - 400 K/uL   Neutrophils Relative % 82 (*) 43 - 77 %   Neutro Abs 9.4 (*) 1.7 - 7.7 K/uL   Lymphocytes Relative 6 (*) 12 - 46 %   Lymphs Abs 0.7  0.7 - 4.0 K/uL   Monocytes Relative 12  3 - 12 %   Monocytes Absolute 1.4 (*) 0.1 - 1.0 K/uL   Eosinophils Relative 0  0 - 5 %   Eosinophils Absolute 0.0  0.0 - 0.7 K/uL   Basophils Relative 0  0 - 1 %   Basophils Absolute 0.0  0.0 - 0.1 K/uL  COMPREHENSIVE METABOLIC PANEL      Result Value Range   Sodium 134 (*) 137 - 147 mEq/L   Potassium 4.1  3.7 - 5.3 mEq/L   Chloride 97  96 - 112 mEq/L   CO2 21  19 - 32 mEq/L   Glucose, Bld 96  70 - 99 mg/dL   BUN 18  6 - 23 mg/dL   Creatinine, Ser 0.98  0.50 - 1.35 mg/dL   Calcium 9.1  8.4 - 10.5 mg/dL   Total Protein 8.2  6.0 - 8.3 g/dL   Albumin 4.0  3.5 - 5.2 g/dL   AST 20  0 - 37 U/L   ALT 26  0 - 53 U/L   Alkaline Phosphatase 82  39 - 117 U/L   Total Bilirubin 0.6  0.3 - 1.2 mg/dL   GFR calc non Af Amer >90  >90 mL/min   GFR calc Af Amer >90  >90 mL/min  LIPASE, BLOOD      Result Value Range   Lipase 29  11 - 59 U/L  URINALYSIS, ROUTINE W REFLEX MICROSCOPIC      Result Value Range   Color, Urine AMBER (*) YELLOW   APPearance CLEAR  CLEAR   Specific Gravity, Urine 1.026  1.005 - 1.030   pH 8.0  5.0 - 8.0   Glucose, UA NEGATIVE  NEGATIVE mg/dL   Hgb urine dipstick NEGATIVE  NEGATIVE   Bilirubin Urine NEGATIVE  NEGATIVE   Ketones, ur >80 (*) NEGATIVE mg/dL   Protein, ur 30 (*) NEGATIVE mg/dL   Urobilinogen, UA 0.2  0.0 - 1.0 mg/dL  Nitrite NEGATIVE  NEGATIVE   Leukocytes, UA NEGATIVE  NEGATIVE  URINE MICROSCOPIC-ADD ON      Result  Value Range   Squamous Epithelial / LPF RARE  RARE   RBC / HPF 3-6  <3 RBC/hpf   Bacteria, UA RARE  RARE   Ct Abdomen Pelvis W Contrast  03/15/2013   ADDENDUM REPORT: 03/15/2013 20:14  ADDENDUM: Not mentioned in the impressions is a borderline enlarged low retroperitoneal node. Favored to be reactive.   Electronically Signed   By: Abigail Miyamoto M.D.   On: 03/15/2013 20:14   03/15/2013   CLINICAL DATA:  Lower pelvic pain and history of diverticulitis.  EXAM: CT ABDOMEN AND PELVIS WITH CONTRAST  TECHNIQUE: Multidetector CT imaging of the abdomen and pelvis was performed using the standard protocol following bolus administration of intravenous contrast.  CONTRAST:  155mL OMNIPAQUE IOHEXOL 300 MG/ML  SOLN  COMPARISON:  CT ABD/PELVIS W CM dated 01/23/2013; CT PELVIS W/CM dated 01/20/2008; CT PELVIS W/CM dated 10/25/2008  FINDINGS: Lower Chest: Clear lung bases. Normal heart size without pericardial or pleural effusion.  Abdomen/Pelvis: Normal liver, spleen, stomach, pancreas, gallbladder, biliary tract, adrenal glands, kidneys. A left periaortic node which measures 1.0 cm on image 33 and is unchanged. This node measures 7 mm back on 10/25/2008.  Marked wall thickening involves the sigmoid, in the region of diverticula. Mild surrounding edema. No free perforation. Either within or immediately adjacent to the anterior superior wall of the sigmoid is a developing fluid and gas collection, which measures 2.4 cm on coronal image 40. The extent of wall thickening is similar to on the prior exam.  Diverticula identified throughout the remainder of the colon. Normal terminal ileum and appendix. Normal small bowel without abdominal ascites. No pelvic adenopathy. Normal urinary bladder and prostate. No significant free fluid.  Bones/Musculoskeletal:  No acute osseous abnormality.  IMPRESSION: 1. Residual or recurrent sigmoid diverticulitis. Suspect a developing abscess either within or immediately adjacent to the sigmoid. This  is likely not percutaneously drainable, given its size and position. 2. Given the extent of focal colonic wall thickening, consider clinical and possibly colonoscopy follow-up to exclude underlying neoplasm.  Electronically Signed: By: Abigail Miyamoto M.D. On: 03/15/2013 19:48      EKG Interpretation   None       MDM   1. Diverticulitis     Patient with history of diverticulitis, and new left lower quadrant pain. Check basic labs. Patient had a CT scan 8 weeks ago, however his girlfriend says that the symptoms are worse, the patient looks worse, and has deteriorated and lost a lot of weight. His last colonoscopy was in 2013. I will repeat the CT abdomen pelvis, because the symptoms have worsened.  8:57 PM Patient discussed with Dr. Darl Householder, who recommends medical admission as there is nothing that is percutaneously drainable.  Will start cipro/flagyl.  9:29 PM Discussed the patient with Dr. Zella Richer, who will add the patient to the list to be seen tomorrow.    Montine Circle, PA-C 03/15/13 2130

## 2013-03-15 NOTE — ED Provider Notes (Signed)
Medical screening examination/treatment/procedure(s) were performed by non-physician practitioner and as supervising physician I was immediately available for consultation/collaboration.  EKG Interpretation    Date/Time:    Ventricular Rate:    PR Interval:    QRS Duration:   QT Interval:    QTC Calculation:   R Axis:     Text Interpretation:                Wandra Arthurs, MD 03/15/13 2332

## 2013-03-15 NOTE — Telephone Encounter (Signed)
Girlfriend, Michaela Corner, [phone] reports pain has been on and off. The next day after pt called, he was fine, now he is vomiting, had a temp of 100 that is OK now. He has had only sips of water. She gave him zofran at [facility] with Dr Sharlett Iles who advised pt to go to the ER; she stated understanding and will do that.

## 2013-03-15 NOTE — ED Notes (Signed)
Pt reports having generalized lower abdominal pain that radiates to his back that began on Monday. Pt reports nausea, emesis, and constipation. Pt reports using a milk of magnesia last night with positive bowel movements. Pt has a history of diverticulitis and has recently been on antibiotics, which was completed on January 4th.

## 2013-03-15 NOTE — ED Notes (Signed)
Pt requesting more pain meds. PA aware.  

## 2013-03-15 NOTE — Telephone Encounter (Signed)
Telephone call was not routed back to me and Friday's appt is booked. LMOM for pt to call back.

## 2013-03-15 NOTE — ED Notes (Signed)
Pt ambulatory to exam room with steady gait. Pt arrives with family member.

## 2013-03-15 NOTE — H&P (Signed)
[facility] History and Physical  Patient: Timothy Medina  GBT:[id number]  DOB: [birth year]  DOS: the patient was seen and examined on 03/15/2013 PCP: Kathlene November, MD  Chief Complaint: Abdominal pain  HPI: Timothy Medina is a 53 y.o. male with Past medical history of recurrent diverticulitis, 4 episodes in last 1 year with 2 episodes treated without any medication. The patient is coming from home. The patient presents with complaints of abdominal pain associated with fever and chills that has been ongoing since beginning of December on and off. In early December the patient saw his PCP and gastroenterologist for complaint of abdominal pain which was associated with fever and chills. He had an outpatient CT scan which showed sigmoid diverticulitis without any abscess. Patient was placed on Augmentin for 10 days. He continued to have further worsening of his symptoms therefore when he saw the gastroenterology on 22nd December he was placed on further ten-day course of Augmentin. In early January his symptoms to resolve and his appetite returned but since last Sunday he started having episodes of constipation followed by abdominal pain followed by episode of vomiting and fever and chills. This time the pain was more significant than his prior episode and therefore he came to the ED. He says he had similar abdominal pain preceded by constipation and associated with fever, episodes 2 more times in last 1 year and has been having recurrently since he was 26 year old. He had a colonoscopy 2 years ago. His girlfriend give the patient stool softener and fleets enema for his constipation in the last 2 days. The patient has lost nearly 15 pounds over last one year which is not voluntary.  Review of Systems: as mentioned in the history of present illness.  A Comprehensive review of the other systems is negative.  Past Medical History  Diagnosis Date  . Insomnia   . GERD (gastroesophageal  reflux disease)   . Diverticulitis   . Interstitial cystitis   . Personal history of colonic polyps 03/01/2005    adenomatous   . External hemorrhoids without mention of complication   . Family history of malignant neoplasm of gastrointestinal tract    Past Surgical History  Procedure Laterality Date  . Colonoscopy     Social History:  reports that he has never smoked. He has never used smokeless tobacco. He reports that he does not drink alcohol or use illicit drugs. Independent for most of his  ADL.  Allergies  Allergen Reactions  . Meperidine Hcl     REACTION: needed Benadryl for hives    Family History  Problem Relation Age of Onset  . Hypertension Mother   . Diabetes Neg Hx   . Heart disease Neg Hx   . Esophageal cancer Neg Hx   . Stomach cancer Neg Hx   . Colon cancer Other     several fam members-pat side  . Alzheimer's disease Other     several fam member   . Prostate cancer Other     ?cousin    Prior to Admission medications   Medication Sig Start Date End Date Taking? Authorizing Provider  ondansetron (ZOFRAN) 8 MG tablet Take 8 mg by mouth every 8 (eight) hours as needed for nausea or vomiting.   Yes Historical Provider, MD  traMADol (ULTRAM) 50 MG tablet Take 50 mg by mouth once.   Yes Historical Provider, MD    Physical Exam: Filed Vitals:   03/15/13 1605 03/15/13 1837 03/15/13 1919  BP: 125/79 124/70 122/71  Pulse: 80 79 77  Temp: 100.3 F (37.9 C)  99.3 F (37.4 C)  TempSrc: Oral  Oral  Resp: 20 16 18   SpO2: 98% 97% 96%    General: Alert, Awake and Oriented to Time, Place and Person. Appear in mild distress Eyes: PERRL ENT: Oral Mucosa clear moist. Neck: no JVD Cardiovascular: S1 and S2 Present, no Murmur, Peripheral Pulses Present Respiratory: Bilateral Air entry equal and Decreased, Clear to Auscultation,  no Crackles,no wheezes Abdomen: Bowel Sound Present, Soft and left quadrant  tender no guarding or rigidity no rebound Skin: no  Rash Extremities: no Pedal edema, no calf tenderness Neurologic: Grossly Unremarkable.  Labs on Admission:  CBC:  Recent Labs Lab 03/15/13 1617  WBC 11.5*  NEUTROABS 9.4*  HGB 14.6  HCT 41.8  MCV 86.2  PLT 171    CMP     Component Value Date/Time   NA 134* 03/15/2013 1617   K 4.1 03/15/2013 1617   CL 97 03/15/2013 1617   CO2 21 03/15/2013 1617   GLUCOSE 96 03/15/2013 1617   BUN 18 03/15/2013 1617   CREATININE 0.98 03/15/2013 1617   CALCIUM 9.1 03/15/2013 1617   PROT 8.2 03/15/2013 1617   ALBUMIN 4.0 03/15/2013 1617   AST 20 03/15/2013 1617   ALT 26 03/15/2013 1617   ALKPHOS 82 03/15/2013 1617   BILITOT 0.6 03/15/2013 1617   GFRNONAA >90 03/15/2013 1617   GFRAA >90 03/15/2013 1617     Recent Labs Lab 03/15/13 1617  LIPASE 29   No results found for this basename: AMMONIA,  in the last 168 hours  No results found for this basename: CKTOTAL, CKMB, CKMBINDEX, TROPONINI,  in the last 168 hours BNP (last 3 results) No results found for this basename: PROBNP,  in the last 8760 hours  Radiological Exams on Admission: Ct Abdomen Pelvis W Contrast  03/15/2013   ADDENDUM REPORT: 03/15/2013 20:14  ADDENDUM: Not mentioned in the impressions is a borderline enlarged low retroperitoneal node. Favored to be reactive.   Electronically Signed   By: Abigail Miyamoto M.D.   On: 03/15/2013 20:14   03/15/2013   CLINICAL DATA:  Lower pelvic pain and history of diverticulitis.  EXAM: CT ABDOMEN AND PELVIS WITH CONTRAST  TECHNIQUE: Multidetector CT imaging of the abdomen and pelvis was performed using the standard protocol following bolus administration of intravenous contrast.  CONTRAST:  173mL OMNIPAQUE IOHEXOL 300 MG/ML  SOLN  COMPARISON:  CT ABD/PELVIS W CM dated 01/23/2013; CT PELVIS W/CM dated 01/20/2008; CT PELVIS W/CM dated 10/25/2008  FINDINGS: Lower Chest: Clear lung bases. Normal heart size without pericardial or pleural effusion.  Abdomen/Pelvis: Normal liver, spleen, stomach, pancreas, gallbladder,  biliary tract, adrenal glands, kidneys. A left periaortic node which measures 1.0 cm on image 33 and is unchanged. This node measures 7 mm back on 10/25/2008.  Marked wall thickening involves the sigmoid, in the region of diverticula. Mild surrounding edema. No free perforation. Either within or immediately adjacent to the anterior superior wall of the sigmoid is a developing fluid and gas collection, which measures 2.4 cm on coronal image 40. The extent of wall thickening is similar to on the prior exam.  Diverticula identified throughout the remainder of the colon. Normal terminal ileum and appendix. Normal small bowel without abdominal ascites. No pelvic adenopathy. Normal urinary bladder and prostate. No significant free fluid.  Bones/Musculoskeletal:  No acute osseous abnormality.  IMPRESSION: 1. Residual or recurrent sigmoid diverticulitis. Suspect a developing abscess either within or immediately adjacent  to the sigmoid. This is likely not percutaneously drainable, given its size and position. 2. Given the extent of focal colonic wall thickening, consider clinical and possibly colonoscopy follow-up to exclude underlying neoplasm.  Electronically Signed: By: Abigail Miyamoto M.D. On: 03/15/2013 19:48   Assessment/Plan Principal Problem:   Diverticulitis of intestine with abscess Active Problems:   INTERSTITIAL CYSTITIS   GERD   1. Diverticulitis of intestine with abscess  the patient is presenting with complaints of recurrent abdominal pain associated with fever and vomiting. He has undergone a CT scan this time as well which is showing the presence of 2.4 cm size abscess along with persistent diverticulitis. At present I will give him bowel rest with keeping him n.p.o. I will give him when necessary Zofran, Protonix, IV Cipro and IV Flagyl, IV fluids. Since he has recurrent diverticulitis associated with weight loss he may benefit from elective surgical workup at a later date. I would also consult  general surgery during the hospitalization due to presence of abscess associated with diverticulitis.  2.GERD Protonix  Consults:  general surgery   DVT Prophylaxis: subcutaneous Heparin Nutrition:  n.p.o. except ice chips   Code Status:  full   Family Communication:  friend was present at bedside, opportunity was given to ask question and all questions were answered satisfactorily at the time of interview. Disposition: Admitted to inpatient in med-surge unit.  Author: Berle Mull, MD [facility] Pager: [phone] 03/15/2013, 9:42 PM    If 7PM-7AM, please contact night-coverage [email or web address] Password [email or web address]

## 2013-03-15 NOTE — ED Notes (Signed)
Pt states he is still having some stomach cramping. Pt states he had a Fleets enema and milk o magnesia today and did have a good BM.

## 2013-03-15 NOTE — Telephone Encounter (Signed)
Error

## 2013-03-15 NOTE — Telephone Encounter (Signed)
lmom for girlfriend stating Dr Sharlett Iles is not here, I have no appts tomorrow either. Looked at his insurance card and advised them to go to [facility]; co pay is cheaper. She may call back for questions.

## 2013-03-15 NOTE — ED Notes (Signed)
MD at bedside. 

## 2013-03-16 DIAGNOSIS — G4700 Insomnia, unspecified: Secondary | ICD-10-CM

## 2013-03-16 DIAGNOSIS — K5732 Diverticulitis of large intestine without perforation or abscess without bleeding: Principal | ICD-10-CM

## 2013-03-16 DIAGNOSIS — R51 Headache: Secondary | ICD-10-CM

## 2013-03-16 LAB — CBC
HEMATOCRIT: 39.1 % (ref 39.0–52.0)
Hemoglobin: 13.4 g/dL (ref 13.0–17.0)
MCH: 29.5 pg (ref 26.0–34.0)
MCHC: 34.3 g/dL (ref 30.0–36.0)
MCV: 85.9 fL (ref 78.0–100.0)
Platelets: 176 10*3/uL (ref 150–400)
RBC: 4.55 MIL/uL (ref 4.22–5.81)
RDW: 13.5 % (ref 11.5–15.5)
WBC: 10.4 10*3/uL (ref 4.0–10.5)

## 2013-03-16 LAB — PROTIME-INR
INR: 1.13 (ref 0.00–1.49)
Prothrombin Time: 14.3 seconds (ref 11.6–15.2)

## 2013-03-16 LAB — COMPREHENSIVE METABOLIC PANEL
ALBUMIN: 3.5 g/dL (ref 3.5–5.2)
ALT: 22 U/L (ref 0–53)
AST: 14 U/L (ref 0–37)
Alkaline Phosphatase: 74 U/L (ref 39–117)
BUN: 16 mg/dL (ref 6–23)
CO2: 25 mEq/L (ref 19–32)
CREATININE: 1 mg/dL (ref 0.50–1.35)
Calcium: 8.9 mg/dL (ref 8.4–10.5)
Chloride: 96 mEq/L (ref 96–112)
GFR calc Af Amer: >90 mL/min (ref >90)
GFR calc non Af Amer: 85 mL/min — ABNORMAL LOW (ref >90)
Glucose, Bld: 98 mg/dL (ref 70–99)
POTASSIUM: 4.6 meq/L (ref 3.7–5.3)
Sodium: 132 mEq/L — ABNORMAL LOW (ref 137–147)
Total Bilirubin: 0.8 mg/dL (ref 0.3–1.2)
Total Protein: 7.5 g/dL (ref 6.0–8.3)

## 2013-03-16 MED ORDER — ZOLPIDEM TARTRATE 5 MG PO TABS
5.0000 mg | ORAL_TABLET | Freq: Once | ORAL | Status: DC
  Filled 2013-03-16 (×2): qty 1

## 2013-03-16 MED ORDER — PROCHLORPERAZINE EDISYLATE 5 MG/ML IJ SOLN
10.0000 mg | Freq: Four times a day (QID) | INTRAMUSCULAR | Status: DC | PRN
  Administered 2013-03-16 – 2013-03-17 (×2): 10 mg via INTRAVENOUS
  Filled 2013-03-16 (×2): qty 2

## 2013-03-16 MED ORDER — ACETAMINOPHEN 650 MG RE SUPP
650.0000 mg | RECTAL | Status: DC | PRN
  Administered 2013-03-16: 650 mg via RECTAL
  Filled 2013-03-16: qty 1

## 2013-03-16 MED ORDER — DIPHENHYDRAMINE HCL 25 MG PO CAPS
25.0000 mg | ORAL_CAPSULE | Freq: Once | ORAL | Status: AC
  Administered 2013-03-16: 25 mg via ORAL
  Filled 2013-03-16: qty 1

## 2013-03-16 NOTE — Progress Notes (Signed)
INITIAL NUTRITION ASSESSMENT  DOCUMENTATION CODES Per approved criteria  -Not Applicable   INTERVENTION: - Diet advancement per MD - Educated pt and girlfriend on low fiber diet for diverticulitis. Handouts provided with RD contact information.  - Will continue to monitor   NUTRITION DIAGNOSIS: Inadequate oral intake related to inability to eat as evidenced by NPO.   Goal: Advance diet as tolerated to low fiber diet   Monitor:  Weights, labs, diet advancement   Reason for Assessment: Malnutrition screening tool   53 y.o. male  Admitting Dx: Diverticulitis of intestine with abscess  ASSESSMENT: Admitted with lower abdominal pain that has been occuring on/off since the beginning of December. Had severe abdominal pain with nausea/vomiting and constipation since Monday. Hx of recurrent diverticulitis, 4 episodes in the past year. Used a fleet enema and milk of magnesia yesterday and had a good BM.   Met with pt and girlfriend. Pt reports he tries to follow a healthy diet at home and eat 3 meals/day. States he's lost 20 pounds in the past year. Tried protein shakes but states they didn't help. Reports these diverticulitis flares always start with pt being constipated, pt takes some kind of fiber supplement to help, and then the pain gets worse. Pt c/o nausea, RN provided pt with medication during RD visit. Pt appears very frustrated about his recurring diverticulitis and talked about plans for surgery.     Height: Ht Readings from Last 1 Encounters:  03/16/13 _0  (1.702 m)    Weight: Wt Readings from Last 1 Encounters:  03/16/13 151 lb 0.2 oz (68.5 kg)    Ideal Body Weight: 148 lb   % Ideal Body Weight: 102%  Wt Readings from Last 10 Encounters:  03/16/13 151 lb 0.2 oz (68.5 kg)  02/12/13 155 lb 4 oz (70.421 kg)  01/22/13 152 lb 3.2 oz (69.037 kg)  12/06/12 160 lb (72.576 kg)  03/09/12 165 lb (74.844 kg)  12/03/11 167 lb 9.6 oz (76.023 kg)  04/19/11 161 lb 3.2 oz  (73.12 kg)  03/04/11 159 lb 6.4 oz (72.303 kg)  02/08/11 163 lb (73.936 kg)  01/22/11 164 lb (74.39 kg)    Usual Body Weight: 171 lb per pt  % Usual Body Weight: 88%  BMI:  Body mass index is 23.65 kg/(m^2).  Estimated Nutritional Needs: Kcal: 1700-1900 Protein: 80-100g Fluid: 1.7-1.9L/day  Skin: Intact   Diet Order: NPO  EDUCATION NEEDS: -Education needs addressed - educated pt on low fiber diet for diverticulitis    Intake/Output Summary (Last 24 hours) at 03/16/13 0931 Last data filed at 03/16/13 0629  Gross per 24 hour  Intake 598.33 ml  Output    250 ml  Net 348.33 ml    Last BM: 1/22  Labs:   Recent Labs Lab 03/15/13 1617 03/16/13 0745  NA 134* 132*  K 4.1 4.6  CL 97 96  CO2 21 25  BUN 18 16  CREATININE 0.98 1.00  CALCIUM 9.1 8.9  GLUCOSE 96 98    CBG (last 3)  No results found for this basename: GLUCAP,  in the last 72 hours  Scheduled Meds: . ciprofloxacin  400 mg Intravenous Q12H  . enoxaparin (LOVENOX) injection  40 mg Subcutaneous QHS  . metronidazole  500 mg Intravenous Q8H  . pantoprazole (PROTONIX) IV  40 mg Intravenous Q12H  . zolpidem  5 mg Oral Once    Continuous Infusions: . sodium chloride 100 mL/hr at 03/16/13 0427    Past Medical History  Diagnosis  Date  . Insomnia   . GERD (gastroesophageal reflux disease)   . Diverticulitis   . Interstitial cystitis   . Personal history of colonic polyps 03/01/2005    adenomatous   . External hemorrhoids without mention of complication   . Family history of malignant neoplasm of gastrointestinal tract     Past Surgical History  Procedure Laterality Date  . Colonoscopy      Mikey College MS, [place], [employer] Pager [phone] After Hours Pager

## 2013-03-16 NOTE — Progress Notes (Signed)
General surgery attending note:  I have personally interviewed and examined this patient this morning. I agree with the assessment and treatment plan outlined by Dr. Zella Richer and Mr. Creig Hines PA.  He is stable and says his pain is better. No nausea or vomiting. No stools. Abdomen soft but tender in the suprapubic area. He says this is better. CT reviewed. Pericolonic phlegmon noted but no drainable abscess.  Hopefully this episode will settle down and allow discharge home on oral antibiotics for 2 weeks or so.  Possible Colonoscopy in 6-8 weeks, however he did have a colonoscopy only 2 years ago.. Given the recurrent nature of his disease, elective resection will need to be considered, and I discussed this with him in some detail. For now,  bowel rest and antibiotics and hydration.    Timothy Medina. [name], M.D., [facility] General and Minimally invasive Surgery Breast and Colorectal Surgery Office:   [phone] Pager:   [phone]

## 2013-03-16 NOTE — Progress Notes (Signed)
[facility] PROGRESS NOTE  Timothy Medina OEU:[id number] DOB: 09/15/1960 DOA: 03/15/2013 PCP: Kathlene November, MD  Assessment/Plan: 1. Diverticulitis of intestine with abscess  the patient is presenting with complaints of recurrent abdominal pain associated with fever and vomiting. He has undergone a CT scan this time as well which is showing the presence of 2.4 cm size abscess along with persistent diverticulitis.  At present I will give him bowel rest with keeping him n.p.o. I will give him when necessary Zofran, Protonix, IV Cipro and IV Flagyl, IV fluids.  Since he has recurrent diverticulitis associated with weight loss he may benefit from elective surgical workup at a later date. I would also consult general surgery during the hospitalization due to presence of abscess associated with diverticulitis.  2. GERD - Cont on protonix  Code Status: Full Family Communication: Pt in room (indicate person spoken with, relationship, and if by phone, the number) Disposition Plan: Pending   Consultants:  General Surgery  Antibiotics:  Ciprofloxacin 03/15/13>>>  Flagyl 03/15/13>>>  HPI/Subjective: No acute events noted overnight  Objective: Filed Vitals:   03/15/13 2301 03/15/13 2352 03/16/13 0428 03/16/13 0623  BP: 119/69 125/76  120/73  Pulse: 78 78  78  Temp: 98.7 F (37.1 C) 98.9 F (37.2 C)  97.5 F (36.4 C)  TempSrc: Oral Oral  Oral  Resp: 16 18  18   Height:   5\' 7"  (1.702 m)   Weight:    68.5 kg (151 lb 0.2 oz)  SpO2: 98% 97%  97%    Intake/Output Summary (Last 24 hours) at 03/16/13 0908 Last data filed at 03/16/13 1540  Gross per 24 hour  Intake 598.33 ml  Output    250 ml  Net 348.33 ml   Filed Weights   03/16/13 0623  Weight: 68.5 kg (151 lb 0.2 oz)    Exam:   General:  Awake, in nad  Cardiovascular: regular, s1, s2  Respiratory: normal resp effort, no wheezing  Abdomen: soft, nondistended  Musculoskeletal: perfused, no clubbing   Data  Reviewed: Basic Metabolic Panel:  Recent Labs Lab 03/15/13 1617 03/16/13 0745  NA 134* 132*  K 4.1 4.6  CL 97 96  CO2 21 25  GLUCOSE 96 98  BUN 18 16  CREATININE 0.98 1.00  CALCIUM 9.1 8.9   Liver Function Tests:  Recent Labs Lab 03/15/13 1617 03/16/13 0745  AST 20 14  ALT 26 22  ALKPHOS 82 74  BILITOT 0.6 0.8  PROT 8.2 7.5  ALBUMIN 4.0 3.5    Recent Labs Lab 03/15/13 1617  LIPASE 29   No results found for this basename: AMMONIA,  in the last 168 hours CBC:  Recent Labs Lab 03/15/13 1617 03/16/13 0745  WBC 11.5* 10.4  NEUTROABS 9.4*  --   HGB 14.6 13.4  HCT 41.8 39.1  MCV 86.2 85.9  PLT 171 176   Cardiac Enzymes: No results found for this basename: CKTOTAL, CKMB, CKMBINDEX, TROPONINI,  in the last 168 hours BNP (last 3 results) No results found for this basename: PROBNP,  in the last 8760 hours CBG: No results found for this basename: GLUCAP,  in the last 168 hours  No results found for this or any previous visit (from the past 240 hour(s)).   Studies: Ct Abdomen Pelvis W Contrast  03/15/2013   ADDENDUM REPORT: 03/15/2013 20:14  ADDENDUM: Not mentioned in the impressions is a borderline enlarged low retroperitoneal node. Favored to be reactive.   Electronically Signed   By: Marylyn Ishihara  Jobe Igo M.D.   On: 03/15/2013 20:14   03/15/2013   CLINICAL DATA:  Lower pelvic pain and history of diverticulitis.  EXAM: CT ABDOMEN AND PELVIS WITH CONTRAST  TECHNIQUE: Multidetector CT imaging of the abdomen and pelvis was performed using the standard protocol following bolus administration of intravenous contrast.  CONTRAST:  187mL OMNIPAQUE IOHEXOL 300 MG/ML  SOLN  COMPARISON:  CT ABD/PELVIS W CM dated 01/23/2013; CT PELVIS W/CM dated 01/20/2008; CT PELVIS W/CM dated 10/25/2008  FINDINGS: Lower Chest: Clear lung bases. Normal heart size without pericardial or pleural effusion.  Abdomen/Pelvis: Normal liver, spleen, stomach, pancreas, gallbladder, biliary tract, adrenal glands,  kidneys. A left periaortic node which measures 1.0 cm on image 33 and is unchanged. This node measures 7 mm back on 10/25/2008.  Marked wall thickening involves the sigmoid, in the region of diverticula. Mild surrounding edema. No free perforation. Either within or immediately adjacent to the anterior superior wall of the sigmoid is a developing fluid and gas collection, which measures 2.4 cm on coronal image 40. The extent of wall thickening is similar to on the prior exam.  Diverticula identified throughout the remainder of the colon. Normal terminal ileum and appendix. Normal small bowel without abdominal ascites. No pelvic adenopathy. Normal urinary bladder and prostate. No significant free fluid.  Bones/Musculoskeletal:  No acute osseous abnormality.  IMPRESSION: 1. Residual or recurrent sigmoid diverticulitis. Suspect a developing abscess either within or immediately adjacent to the sigmoid. This is likely not percutaneously drainable, given its size and position. 2. Given the extent of focal colonic wall thickening, consider clinical and possibly colonoscopy follow-up to exclude underlying neoplasm.  Electronically Signed: By: Abigail Miyamoto M.D. On: 03/15/2013 19:48    Scheduled Meds: . ciprofloxacin  400 mg Intravenous Q12H  . enoxaparin (LOVENOX) injection  40 mg Subcutaneous QHS  . metronidazole  500 mg Intravenous Q8H  . pantoprazole (PROTONIX) IV  40 mg Intravenous Q12H  . zolpidem  5 mg Oral Once   Continuous Infusions: . sodium chloride 100 mL/hr at 03/16/13 7253    Principal Problem:   Diverticulitis of intestine with abscess Active Problems:   INTERSTITIAL CYSTITIS   GERD  Time spent: 43min  CH[facility], [place] [facility] Pager [phone]. If 7PM-7AM, please contact night-coverage at [email or web address], password [facility] 03/16/2013, 9:08 AM  LOS: 1 day

## 2013-03-16 NOTE — Progress Notes (Signed)
Subjective: Up in chair back is hurting. He is rather depressed about this reoccurring diverticulitis.  It has been an issue for years and he just got the last bill paid off.  Objective: Vital signs in last 24 hours: Temp:  [97.5 F (36.4 C)-100.3 F (37.9 C)] 97.5 F (36.4 C) (01/23 3536) Pulse Rate:  [77-80] 78 (01/23 0623) Resp:  [16-20] 18 (01/23 0623) BP: (119-125)/(69-79) 120/73 mmHg (01/23 0623) SpO2:  [96 %-98 %] 97 % (01/23 0623) Weight:  [68.5 kg (151 lb 0.2 oz)] 68.5 kg (151 lb 0.2 oz) (01/23 0623) Last BM Date: 03/15/13 NPO Afebrile after admit, VSS Intake/Output from previous day: 01/22 0701 - 01/23 0700 In: 598.3 [I.V.:598.3] Out: 250 [Urine:250] Intake/Output this shift:    General appearance: alert, cooperative, no distress and up in chair, anxiouos. GI: still tender/sore, better than last PM.  + BS  Lab Results:   Recent Labs  03/15/13 1617 03/16/13 0745  WBC 11.5* 10.4  HGB 14.6 13.4  HCT 41.8 39.1  PLT 171 176    BMET  Recent Labs  03/15/13 1617 03/16/13 0745  NA 134* 132*  K 4.1 4.6  CL 97 96  CO2 21 25  GLUCOSE 96 98  BUN 18 16  CREATININE 0.98 1.00  CALCIUM 9.1 8.9   PT/INR  Recent Labs  03/16/13 0745  LABPROT 14.3  INR 1.13     Recent Labs Lab 03/15/13 1617 03/16/13 0745  AST 20 14  ALT 26 22  ALKPHOS 82 74  BILITOT 0.6 0.8  PROT 8.2 7.5  ALBUMIN 4.0 3.5     Lipase     Component Value Date/Time   LIPASE 29 03/15/2013 1617     Studies/Results: Ct Abdomen Pelvis W Contrast  03/15/2013   ADDENDUM REPORT: 03/15/2013 20:14  ADDENDUM: Not mentioned in the impressions is a borderline enlarged low retroperitoneal node. Favored to be reactive.   Electronically Signed   By: Abigail Miyamoto M.D.   On: 03/15/2013 20:14   03/15/2013   CLINICAL DATA:  Lower pelvic pain and history of diverticulitis.  EXAM: CT ABDOMEN AND PELVIS WITH CONTRAST  TECHNIQUE: Multidetector CT imaging of the abdomen and pelvis was performed using  the standard protocol following bolus administration of intravenous contrast.  CONTRAST:  169mL OMNIPAQUE IOHEXOL 300 MG/ML  SOLN  COMPARISON:  CT ABD/PELVIS W CM dated 01/23/2013; CT PELVIS W/CM dated 01/20/2008; CT PELVIS W/CM dated 10/25/2008  FINDINGS: Lower Chest: Clear lung bases. Normal heart size without pericardial or pleural effusion.  Abdomen/Pelvis: Normal liver, spleen, stomach, pancreas, gallbladder, biliary tract, adrenal glands, kidneys. A left periaortic node which measures 1.0 cm on image 33 and is unchanged. This node measures 7 mm back on 10/25/2008.  Marked wall thickening involves the sigmoid, in the region of diverticula. Mild surrounding edema. No free perforation. Either within or immediately adjacent to the anterior superior wall of the sigmoid is a developing fluid and gas collection, which measures 2.4 cm on coronal image 40. The extent of wall thickening is similar to on the prior exam.  Diverticula identified throughout the remainder of the colon. Normal terminal ileum and appendix. Normal small bowel without abdominal ascites. No pelvic adenopathy. Normal urinary bladder and prostate. No significant free fluid.  Bones/Musculoskeletal:  No acute osseous abnormality.  IMPRESSION: 1. Residual or recurrent sigmoid diverticulitis. Suspect a developing abscess either within or immediately adjacent to the sigmoid. This is likely not percutaneously drainable, given its size and position. 2. Given the extent of  focal colonic wall thickening, consider clinical and possibly colonoscopy follow-up to exclude underlying neoplasm.  Electronically Signed: By: Abigail Miyamoto M.D. On: 03/15/2013 19:48    Medications: . ciprofloxacin  400 mg Intravenous Q12H  . enoxaparin (LOVENOX) injection  40 mg Subcutaneous QHS  . metronidazole  500 mg Intravenous Q8H  . pantoprazole (PROTONIX) IV  40 mg Intravenous Q12H  . zolpidem  5 mg Oral Once    Assessment/Plan Recurrent sigmoid diverticulitis with  evolving abscess.  Bouts in December and January, hospitalize 02/2008 for diverticulitis also. Hx of constipation Hx of interstitial cystitis Anorexia and weight loss GERD Insomnia  Plan:  Antibiotics IV, bowel rest, hydration.  Kpad for his back PRN        LOS: 1 day    Evalette Montrose 03/16/2013

## 2013-03-17 LAB — BASIC METABOLIC PANEL
BUN: 14 mg/dL (ref 6–23)
CALCIUM: 8.2 mg/dL — AB (ref 8.4–10.5)
CHLORIDE: 98 meq/L (ref 96–112)
CO2: 22 meq/L (ref 19–32)
CREATININE: 0.93 mg/dL (ref 0.50–1.35)
GFR calc Af Amer: >90 mL/min (ref >90)
GFR calc non Af Amer: >90 mL/min (ref >90)
Glucose, Bld: 90 mg/dL (ref 70–99)
Potassium: 4 mEq/L (ref 3.7–5.3)
Sodium: 134 mEq/L — ABNORMAL LOW (ref 137–147)

## 2013-03-17 LAB — CBC
HEMATOCRIT: 36.5 % — AB (ref 39.0–52.0)
Hemoglobin: 12.2 g/dL — ABNORMAL LOW (ref 13.0–17.0)
MCH: 28.8 pg (ref 26.0–34.0)
MCHC: 33.4 g/dL (ref 30.0–36.0)
MCV: 86.3 fL (ref 78.0–100.0)
Platelets: 152 10*3/uL (ref 150–400)
RBC: 4.23 MIL/uL (ref 4.22–5.81)
RDW: 13.4 % (ref 11.5–15.5)
WBC: 8.3 10*3/uL (ref 4.0–10.5)

## 2013-03-17 MED ORDER — DIPHENHYDRAMINE HCL 25 MG PO CAPS
25.0000 mg | ORAL_CAPSULE | Freq: Every evening | ORAL | Status: DC | PRN
  Administered 2013-03-17 – 2013-03-18 (×2): 25 mg via ORAL
  Filled 2013-03-17 (×2): qty 1

## 2013-03-17 NOTE — Progress Notes (Signed)
[facility] PROGRESS NOTE  Timothy Medina [id number]:[id number] DOB: Jun 04, 1960 DOA: 03/15/2013 PCP: Kathlene November, MD  Assessment/Plan: 1. Diverticulitis of intestine with abscess  - Surgery following - Slowly improving - Tolerating cipro and flagyl - Overnight, no fevers, no leukocytosis this AM  2. GERD - Cont on protonix  Code Status: Full Family Communication: Pt in room (indicate person spoken with, relationship, and if by phone, the number) Disposition Plan: Pending  Consultants:  General Surgery  Antibiotics:  Ciprofloxacin 03/15/13>>>  Flagyl 03/15/13>>>  HPI/Subjective: No acute events noted overnight. Feels a little better.  Objective: Filed Vitals:   03/16/13 1440 03/16/13 1652 03/16/13 2138 03/17/13 0615  BP: 117/74  109/60 97/63  Pulse: 84  76 85  Temp: 102.4 F (39.1 C) 99.4 F (37.4 C) 98.8 F (37.1 C) 98.3 F (36.8 C)  TempSrc: Oral Oral Oral Oral  Resp: 18  18 18   Height:      Weight:      SpO2: 96%  97% 95%   No intake or output data in the 24 hours ending 03/17/13 1128 Filed Weights   03/16/13 0623  Weight: 68.5 kg (151 lb 0.2 oz)    Exam:   General:  Awake, in nad  Cardiovascular: regular, s1, s2  Respiratory: normal resp effort, no wheezing  Abdomen: soft, nondistended  Musculoskeletal: perfused, no clubbing   Data Reviewed: Basic Metabolic Panel:  Recent Labs Lab 03/15/13 1617 03/16/13 0745 03/17/13 0524  NA 134* 132* 134*  K 4.1 4.6 4.0  CL 97 96 98  CO2 21 25 22   GLUCOSE 96 98 90  BUN 18 16 14   CREATININE 0.98 1.00 0.93  CALCIUM 9.1 8.9 8.2*   Liver Function Tests:  Recent Labs Lab 03/15/13 1617 03/16/13 0745  AST 20 14  ALT 26 22  ALKPHOS 82 74  BILITOT 0.6 0.8  PROT 8.2 7.5  ALBUMIN 4.0 3.5    Recent Labs Lab 03/15/13 1617  LIPASE 29   No results found for this basename: AMMONIA,  in the last 168 hours CBC:  Recent Labs Lab 03/15/13 1617 03/16/13 0745 03/17/13 0524  WBC 11.5* 10.4  8.3  NEUTROABS 9.4*  --   --   HGB 14.6 13.4 12.2*  HCT 41.8 39.1 36.5*  MCV 86.2 85.9 86.3  PLT 171 176 152   Cardiac Enzymes: No results found for this basename: CKTOTAL, CKMB, CKMBINDEX, TROPONINI,  in the last 168 hours BNP (last 3 results) No results found for this basename: PROBNP,  in the last 8760 hours CBG: No results found for this basename: GLUCAP,  in the last 168 hours  No results found for this or any previous visit (from the past 240 hour(s)).   Studies: Ct Abdomen Pelvis W Contrast  03/15/2013   ADDENDUM REPORT: 03/15/2013 20:14  ADDENDUM: Not mentioned in the impressions is a borderline enlarged low retroperitoneal node. Favored to be reactive.   Electronically Signed   By: Abigail Miyamoto M.D.   On: 03/15/2013 20:14   03/15/2013   CLINICAL DATA:  Lower pelvic pain and history of diverticulitis.  EXAM: CT ABDOMEN AND PELVIS WITH CONTRAST  TECHNIQUE: Multidetector CT imaging of the abdomen and pelvis was performed using the standard protocol following bolus administration of intravenous contrast.  CONTRAST:  110mL OMNIPAQUE IOHEXOL 300 MG/ML  SOLN  COMPARISON:  CT ABD/PELVIS W CM dated 01/23/2013; CT PELVIS W/CM dated 01/20/2008; CT PELVIS W/CM dated 10/25/2008  FINDINGS: Lower Chest: Clear lung bases. Normal heart size without  pericardial or pleural effusion.  Abdomen/Pelvis: Normal liver, spleen, stomach, pancreas, gallbladder, biliary tract, adrenal glands, kidneys. A left periaortic node which measures 1.0 cm on image 33 and is unchanged. This node measures 7 mm back on 10/25/2008.  Marked wall thickening involves the sigmoid, in the region of diverticula. Mild surrounding edema. No free perforation. Either within or immediately adjacent to the anterior superior wall of the sigmoid is a developing fluid and gas collection, which measures 2.4 cm on coronal image 40. The extent of wall thickening is similar to on the prior exam.  Diverticula identified throughout the remainder of the  colon. Normal terminal ileum and appendix. Normal small bowel without abdominal ascites. No pelvic adenopathy. Normal urinary bladder and prostate. No significant free fluid.  Bones/Musculoskeletal:  No acute osseous abnormality.  IMPRESSION: 1. Residual or recurrent sigmoid diverticulitis. Suspect a developing abscess either within or immediately adjacent to the sigmoid. This is likely not percutaneously drainable, given its size and position. 2. Given the extent of focal colonic wall thickening, consider clinical and possibly colonoscopy follow-up to exclude underlying neoplasm.  Electronically Signed: By: Abigail Miyamoto M.D. On: 03/15/2013 19:48    Scheduled Meds: . ciprofloxacin  400 mg Intravenous Q12H  . enoxaparin (LOVENOX) injection  40 mg Subcutaneous QHS  . metronidazole  500 mg Intravenous Q8H  . pantoprazole (PROTONIX) IV  40 mg Intravenous Q12H  . zolpidem  5 mg Oral Once   Continuous Infusions: . sodium chloride 100 mL/hr at 03/17/13 0602    Principal Problem:   Diverticulitis of intestine with abscess Active Problems:   INTERSTITIAL CYSTITIS   GERD  Time spent: 20min  CH[facility], [place] [facility] Pager [phone]. If 7PM-7AM, please contact night-coverage at [email or web address], password [facility] 03/17/2013, 11:28 AM  LOS: 2 days

## 2013-03-17 NOTE — Progress Notes (Signed)
Patient ID: Timothy Medina, male   DOB: 03/16/1960, 53 y.o.   MRN: [id number]  [facility] - Progress Note  Subjective: Patient feels somewhat better today.  Continues to have pain.  No nausea or emesis.  Objective: Vital signs in last 24 hours: Temp:  [98.3 F (36.8 C)-102.4 F (39.1 C)] 98.3 F (36.8 C) (01/24 0615) Pulse Rate:  [76-85] 85 (01/24 0615) Resp:  [18] 18 (01/24 0615) BP: (97-117)/(60-74) 97/63 mmHg (01/24 0615) SpO2:  [95 %-97 %] 95 % (01/24 0615) Last BM Date: 03/15/13  Intake/Output from previous day:    Exam: HEENT - clear, not icteric Neck - soft Chest - clear bilaterally Cor - RRR, no murmur Abd - soft, non-distended; mild tenderness suprapubic region Ext - no significant edema Neuro - grossly intact, no focal deficits  Lab Results:   Recent Labs  03/16/13 0745 03/17/13 0524  WBC 10.4 8.3  HGB 13.4 12.2*  HCT 39.1 36.5*  PLT 176 152     Recent Labs  03/16/13 0745 03/17/13 0524  NA 132* 134*  K 4.6 4.0  CL 96 98  CO2 25 22  GLUCOSE 98 90  BUN 16 14  CREATININE 1.00 0.93  CALCIUM 8.9 8.2*    Studies/Results: Ct Abdomen Pelvis W Contrast  03/15/2013   ADDENDUM REPORT: 03/15/2013 20:14  ADDENDUM: Not mentioned in the impressions is a borderline enlarged low retroperitoneal node. Favored to be reactive.   Electronically Signed   By: Abigail Miyamoto M.D.   On: 03/15/2013 20:14   03/15/2013   CLINICAL DATA:  Lower pelvic pain and history of diverticulitis.  EXAM: CT ABDOMEN AND PELVIS WITH CONTRAST  TECHNIQUE: Multidetector CT imaging of the abdomen and pelvis was performed using the standard protocol following bolus administration of intravenous contrast.  CONTRAST:  123mL OMNIPAQUE IOHEXOL 300 MG/ML  SOLN  COMPARISON:  CT ABD/PELVIS W CM dated 01/23/2013; CT PELVIS W/CM dated 01/20/2008; CT PELVIS W/CM dated 10/25/2008  FINDINGS: Lower Chest: Clear lung bases. Normal heart size without pericardial or  pleural effusion.  Abdomen/Pelvis: Normal liver, spleen, stomach, pancreas, gallbladder, biliary tract, adrenal glands, kidneys. A left periaortic node which measures 1.0 cm on image 33 and is unchanged. This node measures 7 mm back on 10/25/2008.  Marked wall thickening involves the sigmoid, in the region of diverticula. Mild surrounding edema. No free perforation. Either within or immediately adjacent to the anterior superior wall of the sigmoid is a developing fluid and gas collection, which measures 2.4 cm on coronal image 40. The extent of wall thickening is similar to on the prior exam.  Diverticula identified throughout the remainder of the colon. Normal terminal ileum and appendix. Normal small bowel without abdominal ascites. No pelvic adenopathy. Normal urinary bladder and prostate. No significant free fluid.  Bones/Musculoskeletal:  No acute osseous abnormality.  IMPRESSION: 1. Residual or recurrent sigmoid diverticulitis. Suspect a developing abscess either within or immediately adjacent to the sigmoid. This is likely not percutaneously drainable, given its size and position. 2. Given the extent of focal colonic wall thickening, consider clinical and possibly colonoscopy follow-up to exclude underlying neoplasm.  Electronically Signed: By: Abigail Miyamoto M.D. On: 03/15/2013 19:48    Assessment / Plan: 1.  Acute diverticulitis with phlegmon  IV Cipro and Flagyl  Allow clear liquid diet today  Encouraged ambulation  Earnstine Regal, MD, [facility] Office: [phone]  03/17/2013

## 2013-03-18 NOTE — Progress Notes (Signed)
Patient ID: Timothy Medina, male   DOB: 19-Apr-1960, 53 y.o.   MRN: [id number]  [facility] - Progress Note  Subjective: Patient feels better today.  Minimal pain.  No nausea or emesis.  Having flatus, no BM yet  Objective: Vital signs in last 24 hours: Temp:  [97.8 F (36.6 C)-98.6 F (37 C)] 97.9 F (36.6 C) (01/25 0624) Pulse Rate:  [67-72] 67 (01/25 0624) Resp:  [16-18] 18 (01/25 0624) BP: (105-124)/(61-74) 105/61 mmHg (01/25 0624) SpO2:  [97 %-98 %] 98 % (01/25 0624) Last BM Date: 03/16/13  Intake/Output from previous day: 01/24 0701 - 01/25 0700 In: 1825.6 [P.O.:1320; I.V.:505.6] Out: -   Exam: Gen: NAD Abd - soft, non-distended; mild tenderness LLQ region Ext - no significant edema Neuro - grossly intact, no focal deficits  Lab Results:   Recent Labs  03/16/13 0745 03/17/13 0524  WBC 10.4 8.3  HGB 13.4 12.2*  HCT 39.1 36.5*  PLT 176 152     Recent Labs  03/16/13 0745 03/17/13 0524  NA 132* 134*  K 4.6 4.0  CL 96 98  CO2 25 22  GLUCOSE 98 90  BUN 16 14  CREATININE 1.00 0.93  CALCIUM 8.9 8.2*    Studies/Results: No results found.  Assessment / Plan: 1.  Acute diverticulitis with phlegmon  IV Cipro and Flagyl  Advance to low residue diet  If tolerates diet, can switch to PO Cip/Flagyl  Encouraged ambulation  Will recheck wbc in AM    Rosario Adie, MD  [facility]  03/18/2013

## 2013-03-18 NOTE — Progress Notes (Signed)
[facility] PROGRESS NOTE  Timothy Medina [id number]:[id number] DOB: 1961/02/17 DOA: 03/15/2013 PCP: Kathlene November, MD  Assessment/Plan: 1. Diverticulitis of intestine with abscess  - Surgery following - Slowly improving - Tolerating cipro and flagyl - Overnight, no fevers - Advancing diet as tolerated  2. GERD - Cont on protonix  Code Status: Full Family Communication: Pt in room (indicate person spoken with, relationship, and if by phone, the number) Disposition Plan: Pending  Consultants:  General Surgery  Antibiotics:  Ciprofloxacin 03/15/13>>>  Flagyl 03/15/13>>>  HPI/Subjective: Cont to feel better.  Objective: Filed Vitals:   03/17/13 0615 03/17/13 1403 03/17/13 2136 03/18/13 0624  BP: 97/63 124/74 112/68 105/61  Pulse: 85 70 72 67  Temp: 98.3 F (36.8 C) 98.6 F (37 C) 97.8 F (36.6 C) 97.9 F (36.6 C)  TempSrc: Oral Oral Oral Oral  Resp: 18 16 18 18   Height:      Weight:      SpO2: 95% 97% 98% 98%    Intake/Output Summary (Last 24 hours) at 03/18/13 0839 Last data filed at 03/18/13 0459  Gross per 24 hour  Intake 1825.61 ml  Output      0 ml  Net 1825.61 ml   Filed Weights   03/16/13 0623  Weight: 68.5 kg (151 lb 0.2 oz)    Exam:   General:  Awake, in nad  Cardiovascular: regular, s1, s2  Respiratory: normal resp effort, no wheezing  Abdomen: soft, nondistended  Musculoskeletal: perfused, no clubbing   Data Reviewed: Basic Metabolic Panel:  Recent Labs Lab 03/15/13 1617 03/16/13 0745 03/17/13 0524  NA 134* 132* 134*  K 4.1 4.6 4.0  CL 97 96 98  CO2 21 25 22   GLUCOSE 96 98 90  BUN 18 16 14   CREATININE 0.98 1.00 0.93  CALCIUM 9.1 8.9 8.2*   Liver Function Tests:  Recent Labs Lab 03/15/13 1617 03/16/13 0745  AST 20 14  ALT 26 22  ALKPHOS 82 74  BILITOT 0.6 0.8  PROT 8.2 7.5  ALBUMIN 4.0 3.5    Recent Labs Lab 03/15/13 1617  LIPASE 29   No results found for this basename: AMMONIA,  in the last 168  hours CBC:  Recent Labs Lab 03/15/13 1617 03/16/13 0745 03/17/13 0524  WBC 11.5* 10.4 8.3  NEUTROABS 9.4*  --   --   HGB 14.6 13.4 12.2*  HCT 41.8 39.1 36.5*  MCV 86.2 85.9 86.3  PLT 171 176 152   Cardiac Enzymes: No results found for this basename: CKTOTAL, CKMB, CKMBINDEX, TROPONINI,  in the last 168 hours BNP (last 3 results) No results found for this basename: PROBNP,  in the last 8760 hours CBG: No results found for this basename: GLUCAP,  in the last 168 hours  No results found for this or any previous visit (from the past 240 hour(s)).   Studies: No results found.  Scheduled Meds: . ciprofloxacin  400 mg Intravenous Q12H  . enoxaparin (LOVENOX) injection  40 mg Subcutaneous QHS  . metronidazole  500 mg Intravenous Q8H  . pantoprazole (PROTONIX) IV  40 mg Intravenous Q12H  . zolpidem  5 mg Oral Once   Continuous Infusions: . sodium chloride 100 mL/hr at 03/18/13 0459    Principal Problem:   Diverticulitis of intestine with abscess Active Problems:   INTERSTITIAL CYSTITIS   GERD  Time spent: 4min  CHIU, [place] [facility] Pager [phone]. If 7PM-7AM, please contact night-coverage at [email or web address], password [facility] 03/18/2013, 8:39 AM  LOS:  3 days

## 2013-03-19 ENCOUNTER — Telehealth (INDEPENDENT_AMBULATORY_CARE_PROVIDER_SITE_OTHER): Payer: Self-pay

## 2013-03-19 ENCOUNTER — Other Ambulatory Visit (INDEPENDENT_AMBULATORY_CARE_PROVIDER_SITE_OTHER): Payer: Self-pay | Admitting: *Deleted

## 2013-03-19 DIAGNOSIS — K5732 Diverticulitis of large intestine without perforation or abscess without bleeding: Secondary | ICD-10-CM

## 2013-03-19 DIAGNOSIS — K630 Abscess of intestine: Secondary | ICD-10-CM

## 2013-03-19 LAB — CBC
HEMATOCRIT: 37.1 % — AB (ref 39.0–52.0)
Hemoglobin: 12.5 g/dL — ABNORMAL LOW (ref 13.0–17.0)
MCH: 29 pg (ref 26.0–34.0)
MCHC: 33.7 g/dL (ref 30.0–36.0)
MCV: 86.1 fL (ref 78.0–100.0)
PLATELETS: 191 10*3/uL (ref 150–400)
RBC: 4.31 MIL/uL (ref 4.22–5.81)
RDW: 13.4 % (ref 11.5–15.5)
WBC: 4.9 10*3/uL (ref 4.0–10.5)

## 2013-03-19 MED ORDER — CIPROFLOXACIN HCL 500 MG PO TABS: 500.0000 mg | ORAL_TABLET | Freq: Two times a day (BID) | ORAL | Status: DC

## 2013-03-19 MED ORDER — CIPROFLOXACIN HCL 500 MG PO TABS
500.0000 mg | ORAL_TABLET | Freq: Two times a day (BID) | ORAL | Status: DC
Start: 2013-03-19 — End: 2013-04-03

## 2013-03-19 MED ORDER — METRONIDAZOLE 500 MG PO TABS: 500.0000 mg | ORAL_TABLET | Freq: Three times a day (TID) | ORAL | Status: DC

## 2013-03-19 MED ORDER — CIPROFLOXACIN HCL 500 MG PO TABS
500.0000 mg | ORAL_TABLET | Freq: Two times a day (BID) | ORAL | Status: DC
  Administered 2013-03-19: 500 mg via ORAL
  Filled 2013-03-19 (×2): qty 1

## 2013-03-19 MED ORDER — PANTOPRAZOLE SODIUM 40 MG PO TBEC: 40.0000 mg | DELAYED_RELEASE_TABLET | Freq: Every day | ORAL | Status: DC

## 2013-03-19 MED ORDER — METRONIDAZOLE 500 MG PO TABS
500.0000 mg | ORAL_TABLET | Freq: Three times a day (TID) | ORAL | Status: DC
  Administered 2013-03-19: 500 mg via ORAL
  Filled 2013-03-19 (×3): qty 1

## 2013-03-19 NOTE — Telephone Encounter (Signed)
Message copied by Jeralyn Ruths on Mon Mar 19, 2013  1:09 PM ------      Message from: Saverio Danker      Created: Mon Mar 19, 2013 12:19 PM       Timothy Medina is a patient who will likely come back and see TR in 6 weeks or so after a colonoscopy to discuss surgery for diverticulitis.  I have written him a script for Cirpo Flagyl, but in the past, like 5 years ago, he thought he had trouble with this.  I told him to try it but if it became an issue to call and we could switch him to Augmentin for the remainder of his 4 week course.  I figured since you were TR's nurse I would send this to you, but if you need to forward this to anyone else etc feel free to do so.              Thanks,      KO ------

## 2013-03-19 NOTE — Discharge Instructions (Signed)
Diverticulitis °A diverticulum is a small pouch or sac on the colon. Diverticulosis is the presence of these diverticula on the colon. Diverticulitis is the irritation (inflammation) or infection of diverticula. °CAUSES  °The colon and its diverticula contain bacteria. If food particles block the tiny opening to a diverticulum, the bacteria inside can grow and cause an increase in pressure. This leads to infection and inflammation and is called diverticulitis. °SYMPTOMS  °· Abdominal pain and tenderness. Usually, the pain is located on the left side of your abdomen. However, it could be located elsewhere. °· Fever. °· Bloating. °· Feeling sick to your stomach (nausea). °· Throwing up (vomiting). °· Abnormal stools. °DIAGNOSIS  °Your caregiver will take a history and perform a physical exam. Since many things can cause abdominal pain, other tests may be necessary. Tests may include: °· Blood tests. °· Urine tests. °· X-ray of the abdomen. °· CT scan of the abdomen. °Sometimes, surgery is needed to determine if diverticulitis or other conditions are causing your symptoms. °TREATMENT  °Most of the time, you can be treated without surgery. Treatment includes: °· Resting the bowels by only having liquids for a few days. As you improve, you will need to eat a low-fiber diet. °· Intravenous (IV) fluids if you are losing body fluids (dehydrated). °· Antibiotic medicines that treat infections may be given. °· Pain and nausea medicine, if needed. °· Surgery if the inflamed diverticulum has burst. °HOME CARE INSTRUCTIONS  °· Try a clear liquid diet (broth, tea, or water for as long as directed by your caregiver). You may then gradually begin a low-fiber diet as tolerated.  °A low-fiber diet is a diet with less than 10 grams of fiber. Choose the foods below to reduce fiber in the diet: °· White breads, cereals, rice, and pasta. °· Cooked fruits and vegetables or soft fresh fruits and vegetables without the skin. °· Ground or  well-cooked tender beef, ham, veal, lamb, pork, or poultry. °· Eggs and seafood. °· After your diverticulitis symptoms have improved, your caregiver may put you on a high-fiber diet. A high-fiber diet includes 14 grams of fiber for every 1000 calories consumed. For a standard 2000 calorie diet, you would need 28 grams of fiber. Follow these diet guidelines to help you increase the fiber in your diet. It is important to slowly increase the amount fiber in your diet to avoid gas, constipation, and bloating. °· Choose whole-grain breads, cereals, pasta, and brown rice. °· Choose fresh fruits and vegetables with the skin on. Do not overcook vegetables because the more vegetables are cooked, the more fiber is lost. °· Choose more nuts, seeds, legumes, dried peas, beans, and lentils. °· Look for food products that have greater than 3 grams of fiber per serving on the Nutrition Facts label. °· Take all medicine as directed by your caregiver. °· If your caregiver has given you a follow-up appointment, it is very important that you go. Not going could result in lasting (chronic) or permanent injury, pain, and disability. If there is any problem keeping the appointment, call to reschedule. °SEEK MEDICAL CARE IF:  °· Your pain does not improve. °· You have a hard time advancing your diet beyond clear liquids. °· Your bowel movements do not return to normal. °SEEK IMMEDIATE MEDICAL CARE IF:  °· Your pain becomes worse. °· You have an oral temperature above 102° F (38.9° C), not controlled by medicine. °· You have repeated vomiting. °· You have bloody or black, tarry stools. °·   Symptoms that brought you to your caregiver become worse or are not getting better. MAKE SURE YOU:   Understand these instructions.  Will watch your condition.  Will get help right away if you are not doing well or get worse. Document Released: 11/18/2004 Document Revised: 05/03/2011 Document Reviewed: 03/16/2010 Exit[employer] Patient Information  2014 [employer].   Low-Fiber Diet Fiber is found in fruits, vegetables, and grains. A low-fiber diet restricts fibrous foods that are not digested in the small intestine. A diet containing about 10 grams of fiber is considered low fiber.  PURPOSE  To prevent blockage of a partially obstructed or narrowed gastrointestinal tract.  To reduce fecal weight and volume.  To slow the movement of feces. WHEN IS THIS DIET USED?  It may be used during the acute phase of Crohn disease, ulcerative colitis, regional enteritis, or diverticulitis.  It may be used if your intestinal or esophageal tubes are narrowing (stenosis).  It may be used as a transitional diet following surgery, injury (trauma), or illness. CHOOSING FOODS Check labels, especially on foods from the starch list. Often times, dietary fiber content is listed on the nutrition facts panel. Please ask your Registered Dietitian if you have questions about specific foods that are related to your condition, especially if the food is not listed on this handout. Breads and Starches  Allowed: White, French, and pita breads, plain rolls, buns, or sweet rolls, doughnuts, waffles, pancakes, bagels. Plain muffins, biscuits, matzoth. Soda, saltine, graham crackers. Pretzels, rusks, melba toast, zwieback. Cooked cereals: cornmeal, farina, or cream cereals. Dry cereals: refined corn, wheat, rice, and oat cereals (check label). Potatoes prepared any way without skins, refined macaroni, spaghetti, noodles, refined rice.  Avoid: Whole-wheat bread, rolls, and crackers. Multigrains, rye, bran seeds, nuts, or coconut. Cereals containing whole grains, multigrains, bran, coconut, nuts, raisins. Cooked or dry oatmeal. Coarse wheat cereals, granola. Cereals advertised as "high fiber." Potato skins. Whole-grain pasta, wild or brown rice. Popcorn. Vegetables  Allowed: Strained tomato and vegetable juices. Fresh lettuce, cucumber, spinach. Well-cooked or  canned: asparagus, bean sprouts, broccoli, cut green beans, cauliflower, pumpkin, beets, mushrooms, yellow squash, tomato, tomato sauce, zucchini, turnips.Keep servings limited to  cup.  Avoid: Fresh, cooked, or canned: artichokes, baked beans, beet greens, Brussels sprouts, corn, kale, legumes, peas, sweet potatoes. Avoid large servings of any vegetables. Fruit  Allowed: All fruit juices except prune juice. Cooked or canned fruits without skin and seeds: apricots, applesauce, cantaloupe, cherries, grapefruit, grapes, kiwi, mandarin oranges, peaches, pears, fruit cocktail, pineapple, plums, watermelon. Fresh without skin: banana, grapes, cantaloupe, avocado, cherries, pineapple, kiwi, nectarines, peaches, blueberries. Keep servings limited to  cup or 1 piece.  Avoid: Fresh: apples with or without skin, apricots, mangoes, pears, raspberries, strawberries. Prune juice and juices with pulp, stewed or dried prunes. Dried fruits, raisins, dates. Avoid large servings of all fresh fruits. Meat and Protein Substitutes  Allowed: Ground or well-cooked tender beef, ham, veal, lamb, pork, poultry. Eggs, plain cheese. Fish, oysters, shrimp, lobster, other seafood. Liver, organ meats. Smooth nut butters.  Avoid: Tough, fibrous meats with gristle. Chunky nut butter.Cheese with seeds, nuts, or other foods not allowed. Nuts, seeds, legumes, dried peas, beans, lentils. Dairy  Allowed: All milk products except those not allowed.  Avoid: Yogurt or cheese that contains nuts, seeds, or added fruit. Soups and Combination Foods  Allowed: Bouillon, broth, or cream soups made from allowed foods. Any strained soup. Casseroles or mixed dishes made with allowed foods.  Avoid: Soups made from vegetables that are   not allowed or that contain other foods not allowed. Desserts and Sweets  Allowed:Plain cakes and cookies, pie made with allowed fruit, pudding, custard, cream pie. Gelatin, fruit, ice, sherbet, frozen ice  pops. Ice cream, ice milk without nuts. Plain hard candy, honey, jelly, molasses, syrup, sugar, chocolate syrup, gumdrops, marshmallows.  Avoid: Desserts, cookies, or candies that contain nuts, peanut butter, dried fruits. Jams, preserves with seeds, marmalade. Fats and Oils  Allowed:Margarine, butter, cream, mayonnaise, salad oils, plain salad dressings made from allowed foods.  Avoid: Seeds, nuts, olives. Beverages  Allowed: All, except those listed to avoid.  Avoid: Fruit juices with high pulp, prune juice. Condiments  Allowed:Ketchup, mustard, horseradish, vinegar, cream sauce, cheese sauce, cocoa powder. Spices in moderation: allspice, basil, bay leaves, celery powder or leaves, cinnamon, cumin powder, curry powder, ginger, mace, marjoram, onion or garlic powder, oregano, paprika, parsley flakes, ground pepper, rosemary, sage, savory, tarragon, thyme, turmeric.  Avoid: Coconut, pickles. SAMPLE MENU Breakfast   cup orange juice.  1 boiled egg.  1 slice white toast.  Margarine.   cup cornflakes.  1 cup milk.  Beverage. Lunch   cup chicken noodle soup.  2 to 3 oz sliced roast beef.  2 slices white bread.  Mayonnaise.   cup tomato juice.  1 small banana.  Beverage. Dinner  3 oz baked chicken.   cup scalloped potatoes.   cup cooked beets.  White dinner roll.  Margarine.   cup canned peaches.  Beverage. Document Released: 07/31/2001 Document Revised: 10/11/2012 Document Reviewed: 02/25/2011 Exit[employer] Patient Information 2014 [employer].  

## 2013-03-19 NOTE — Progress Notes (Signed)
Patient ID: Timothy Medina, male   DOB: 1961-02-11, 53 y.o.   MRN: [id number]    Subjective: Pt feels well.  No pain.  Tolerating low fiber diet.  Objective: Vital signs in last 24 hours: Temp:  [97.9 F (36.6 C)-98.9 F (37.2 C)] 97.9 F (36.6 C) (01/26 0534) Pulse Rate:  [60-71] 60 (01/26 0534) Resp:  [18] 18 (01/26 0534) BP: (109-124)/(71-81) 109/71 mmHg (01/26 0534) SpO2:  [97 %-99 %] 99 % (01/26 0534) Weight:  [149 lb 14.6 oz (68 kg)] 149 lb 14.6 oz (68 kg) (01/26 0522) Last BM Date: 03/18/13  Intake/Output from previous day: 01/25 0701 - 01/26 0700 In: 360 [P.O.:360] Out: -  Intake/Output this shift:    PE: Abd: soft, NT, ND, +BS  Lab Results:   Recent Labs  03/17/13 0524 03/19/13 0530  WBC 8.3 4.9  HGB 12.2* 12.5*  HCT 36.5* 37.1*  PLT 152 191   BMET  Recent Labs  03/17/13 0524  NA 134*  K 4.0  CL 98  CO2 22  GLUCOSE 90  BUN 14  CREATININE 0.93  CALCIUM 8.2*   PT/INR No results found for this basename: LABPROT, INR,  in the last 72 hours CMP     Component Value Date/Time   NA 134* 03/17/2013 0524   K 4.0 03/17/2013 0524   CL 98 03/17/2013 0524   CO2 22 03/17/2013 0524   GLUCOSE 90 03/17/2013 0524   BUN 14 03/17/2013 0524   CREATININE 0.93 03/17/2013 0524   CALCIUM 8.2* 03/17/2013 0524   PROT 7.5 03/16/2013 0745   ALBUMIN 3.5 03/16/2013 0745   AST 14 03/16/2013 0745   ALT 22 03/16/2013 0745   ALKPHOS 74 03/16/2013 0745   BILITOT 0.8 03/16/2013 0745   GFRNONAA >90 03/17/2013 0524   GFRAA >90 03/17/2013 0524   Lipase     Component Value Date/Time   LIPASE 29 03/15/2013 1617       Studies/Results: No results found.  Anti-infectives: Anti-infectives   Start     Dose/Rate Route Frequency Ordered Stop   03/19/13 1400  ciprofloxacin (CIPRO) tablet 500 mg     500 mg Oral 2 times daily 03/19/13 1125     03/19/13 1400  metroNIDAZOLE (FLAGYL) tablet 500 mg     500 mg Oral 3 times per day 03/19/13 1125     03/19/13 0000  ciprofloxacin  (CIPRO) 500 MG tablet  Status:  Discontinued     500 mg Oral 2 times daily 03/19/13 1041 03/19/13    03/19/13 0000  metroNIDAZOLE (FLAGYL) 500 MG tablet  Status:  Discontinued     500 mg Oral 3 times daily 03/19/13 1041 03/19/13    03/19/13 0000  ciprofloxacin (CIPRO) 500 MG tablet     500 mg Oral 2 times daily 03/19/13 1134     03/19/13 0000  metroNIDAZOLE (FLAGYL) 500 MG tablet     500 mg Oral Every 8 hours 03/19/13 1134     03/16/13 1000  ciprofloxacin (CIPRO) IVPB 400 mg  Status:  Discontinued     400 mg 200 mL/hr over 60 Minutes Intravenous Every 12 hours 03/15/13 2318 03/19/13 1125   03/16/13 0600  metroNIDAZOLE (FLAGYL) IVPB 500 mg  Status:  Discontinued     500 mg 100 mL/hr over 60 Minutes Intravenous 3 times per day 03/15/13 2318 03/19/13 1125   03/15/13 2100  ciprofloxacin (CIPRO) IVPB 400 mg     400 mg 200 mL/hr over 60 Minutes Intravenous  Once 03/15/13 2040  03/15/13 2347   03/15/13 2045  metroNIDAZOLE (FLAGYL) IVPB 500 mg     500 mg 100 mL/hr over 60 Minutes Intravenous  Once 03/15/13 2040 03/15/13 2234       Assessment/Plan  1. Acute diverticulitis, recurrent, with phlegmon  Plan: 1. Switch to oral abx therapy for at least 4 weeks.  Will keep him on Cipro/Flagyl. 2. Our office is going to make a referral for him to see Dr. Laural Golden for  A c-scope.  The patient would like to consider surgery after that.  He will need to follow up with Dr. Zella Richer after his c-scope.  This was all d/w the patient and his wife. 3. Stable for dc home from our standpoint.  LOS: 4 days    Eliah Marquard E 03/19/2013, 12:18 PM Pager: [phone]

## 2013-03-19 NOTE — Telephone Encounter (Signed)
FYI for triage in case the patient calls.

## 2013-03-19 NOTE — Discharge Summary (Signed)
Physician Discharge Summary  Timothy Medina [id number] DOB: 1960/08/15 DOA: 03/15/2013  PCP: Kathlene November, MD  Admit date: 03/15/2013 Discharge date: 03/19/2013  Time spent: 35 minutes  Recommendations for Outpatient Follow-up:  1. Follow up with PCP in 1-2 weeks 2. Follow up with GI in 6 weeks 3. Follow up with General Surgery as scheduled  Discharge Diagnoses:  Principal Problem:   Diverticulitis of intestine with abscess Active Problems:   INTERSTITIAL CYSTITIS   GERD   Discharge Condition: Improved  Diet recommendation: Regular  Filed Weights   03/16/13 0623 03/19/13 0522  Weight: 68.5 kg (151 lb 0.2 oz) 68 kg (149 lb 14.6 oz)    History of present illness:  Timothy Medina is a 53 y.o. male with Past medical history of recurrent diverticulitis, 4 episodes in last 1 year with 2 episodes treated without any medication.  The patient is coming from home.  The patient presents with complaints of abdominal pain associated with fever and chills that has been ongoing since beginning of December on and off.  In early December the patient saw his PCP and gastroenterologist for complaint of abdominal pain which was associated with fever and chills. He had an outpatient CT scan which showed sigmoid diverticulitis without any abscess. Patient was placed on Augmentin for 10 days. He continued to have further worsening of his symptoms therefore when he saw the gastroenterology on 22nd December he was placed on further ten-day course of Augmentin.  In early January his symptoms to resolve and his appetite returned but since last Sunday he started having episodes of constipation followed by abdominal pain followed by episode of vomiting and fever and chills. This time the pain was more significant than his prior episode and therefore he came to the ED.  He says he had similar abdominal pain preceded by constipation and associated with fever, episodes 2 more times in last 1 year and  has been having recurrently since he was 53 year old.  He had a colonoscopy 2 years ago.  His girlfriend give the patient stool softener and fleets enema for his constipation in the last 2 days.  The patient has lost nearly 15 pounds over last one year which is not voluntary.  Hospital Course:  1. Diverticulitis of intestine with abscess  - Surgery following  - Continuing to improve  - Tolerating cipro and flagyl IV - may consider transitioning to PO on discharge - Remains stable - Advancing diet as tolerated  2. GERD  - Cont on protonix  Consultations:  General Surgery  Discharge Exam: Filed Vitals:   03/18/13 1439 03/18/13 2151 03/19/13 0522 03/19/13 0534  BP: 124/81 122/80  109/71  Pulse: 71 66  60  Temp: 98.9 F (37.2 C) 98.2 F (36.8 C)  97.9 F (36.6 C)  TempSrc: Oral Oral  Oral  Resp: 18 18  18   Height:      Weight:   68 kg (149 lb 14.6 oz)   SpO2: 97% 97%  99%    General: Awake, in nad Cardiovascular: Regular, s1, s2 Respiratory: normal resp effort, no wheezing  Discharge Instructions     Medication List         ciprofloxacin 500 MG tablet  Commonly known as:  CIPRO  Take 1 tablet (500 mg total) by mouth 2 (two) times daily.     metroNIDAZOLE 500 MG tablet  Commonly known as:  FLAGYL  Take 1 tablet (500 mg total) by mouth every 8 (eight) hours.  ondansetron 8 MG tablet  Commonly known as:  ZOFRAN  Take 8 mg by mouth every 8 (eight) hours as needed for nausea or vomiting.     traMADol 50 MG tablet  Commonly known as:  ULTRAM  Take 50 mg by mouth once.       Allergies  Allergen Reactions  . Meperidine Hcl     REACTION: needed Benadryl for hives   Follow-up Information   Follow up with Kathlene November, MD. Schedule an appointment as soon as possible for a visit in 1 week.   Specialty:  Internal Medicine   Contact information:   [phone] [address] [facility] [address] [place] [address] [phone]        The results of  significant diagnostics from this hospitalization (including imaging, microbiology, ancillary and laboratory) are listed below for reference.    Significant Diagnostic Studies: Ct Abdomen Pelvis W Contrast  03/15/2013   ADDENDUM REPORT: 03/15/2013 20:14  ADDENDUM: Not mentioned in the impressions is a borderline enlarged low retroperitoneal node. Favored to be reactive.   Electronically Signed   By: Abigail Miyamoto M.D.   On: 03/15/2013 20:14   03/15/2013   CLINICAL DATA:  Lower pelvic pain and history of diverticulitis.  EXAM: CT ABDOMEN AND PELVIS WITH CONTRAST  TECHNIQUE: Multidetector CT imaging of the abdomen and pelvis was performed using the standard protocol following bolus administration of intravenous contrast.  CONTRAST:  115mL OMNIPAQUE IOHEXOL 300 MG/ML  SOLN  COMPARISON:  CT ABD/PELVIS W CM dated 01/23/2013; CT PELVIS W/CM dated 01/20/2008; CT PELVIS W/CM dated 10/25/2008  FINDINGS: Lower Chest: Clear lung bases. Normal heart size without pericardial or pleural effusion.  Abdomen/Pelvis: Normal liver, spleen, stomach, pancreas, gallbladder, biliary tract, adrenal glands, kidneys. A left periaortic node which measures 1.0 cm on image 33 and is unchanged. This node measures 7 mm back on 10/25/2008.  Marked wall thickening involves the sigmoid, in the region of diverticula. Mild surrounding edema. No free perforation. Either within or immediately adjacent to the anterior superior wall of the sigmoid is a developing fluid and gas collection, which measures 2.4 cm on coronal image 40. The extent of wall thickening is similar to on the prior exam.  Diverticula identified throughout the remainder of the colon. Normal terminal ileum and appendix. Normal small bowel without abdominal ascites. No pelvic adenopathy. Normal urinary bladder and prostate. No significant free fluid.  Bones/Musculoskeletal:  No acute osseous abnormality.  IMPRESSION: 1. Residual or recurrent sigmoid diverticulitis. Suspect a developing  abscess either within or immediately adjacent to the sigmoid. This is likely not percutaneously drainable, given its size and position. 2. Given the extent of focal colonic wall thickening, consider clinical and possibly colonoscopy follow-up to exclude underlying neoplasm.  Electronically Signed: By: Abigail Miyamoto M.D. On: 03/15/2013 19:48    Microbiology: No results found for this or any previous visit (from the past 240 hour(s)).   Labs: Basic Metabolic Panel:  Recent Labs Lab 03/15/13 1617 03/16/13 0745 03/17/13 0524  NA 134* 132* 134*  K 4.1 4.6 4.0  CL 97 96 98  CO2 21 25 22   GLUCOSE 96 98 90  BUN 18 16 14   CREATININE 0.98 1.00 0.93  CALCIUM 9.1 8.9 8.2*   Liver Function Tests:  Recent Labs Lab 03/15/13 1617 03/16/13 0745  AST 20 14  ALT 26 22  ALKPHOS 82 74  BILITOT 0.6 0.8  PROT 8.2 7.5  ALBUMIN 4.0 3.5    Recent Labs Lab 03/15/13  1617  LIPASE 29   No results found for this basename: AMMONIA,  in the last 168 hours CBC:  Recent Labs Lab 03/15/13 1617 03/16/13 0745 03/17/13 0524 03/19/13 0530  WBC 11.5* 10.4 8.3 4.9  NEUTROABS 9.4*  --   --   --   HGB 14.6 13.4 12.2* 12.5*  HCT 41.8 39.1 36.5* 37.1*  MCV 86.2 85.9 86.3 86.1  PLT 171 176 152 191   Cardiac Enzymes: No results found for this basename: CKTOTAL, CKMB, CKMBINDEX, TROPONINI,  in the last 168 hours BNP: BNP (last 3 results) No results found for this basename: PROBNP,  in the last 8760 hours CBG: No results found for this basename: GLUCAP,  in the last 168 hours  Signed:  CHIU, STEPHEN K  [facility] 03/19/2013, 12:14 PM

## 2013-03-19 NOTE — Progress Notes (Signed)
Agree with above.  Home and follow up with Dr. Zella Richer

## 2013-03-22 ENCOUNTER — Other Ambulatory Visit (INDEPENDENT_AMBULATORY_CARE_PROVIDER_SITE_OTHER): Payer: Self-pay | Admitting: *Deleted

## 2013-03-22 MED ORDER — AMOXICILLIN-POT CLAVULANATE 875-125 MG PO TABS: 1.0000 | ORAL_TABLET | Freq: Two times a day (BID) | ORAL | Status: AC

## 2013-03-22 NOTE — Telephone Encounter (Signed)
Pts significant other, Ailene Ravel called stating that pt hasn't been tolerating the cipro/flagyl very well, pt has had no appetite feeling extremely weak and tired.  She wanted to see about pt being switched over the Augmentin.  Per Claiborne Billings below pt can be switched over if he does not tolerate the cipro/flagyl.  Kelle Darting, RN escribed the Augmentin over to pts pharmacy at [facility].  Ailene Ravel made aware.  She is agreeable at this time and will call back with any other questions or concerns.

## 2013-03-23 ENCOUNTER — Encounter (INDEPENDENT_AMBULATORY_CARE_PROVIDER_SITE_OTHER): Payer: Self-pay | Admitting: *Deleted

## 2013-04-03 ENCOUNTER — Encounter (INDEPENDENT_AMBULATORY_CARE_PROVIDER_SITE_OTHER): Payer: Self-pay | Admitting: Internal Medicine

## 2013-04-03 ENCOUNTER — Ambulatory Visit (INDEPENDENT_AMBULATORY_CARE_PROVIDER_SITE_OTHER): Payer: BC Managed Care – PPO | Admitting: Internal Medicine

## 2013-04-03 VITALS — BP 90/58 | HR 68 | Temp 98.2°F | Ht 67.0 in | Wt 152.9 lb

## 2013-04-03 DIAGNOSIS — K5732 Diverticulitis of large intestine without perforation or abscess without bleeding: Secondary | ICD-10-CM

## 2013-04-03 NOTE — Patient Instructions (Signed)
CT abdomen/pelvis with CM. If normal, Colonoscopy

## 2013-04-03 NOTE — Progress Notes (Signed)
Subjective:     Patient ID: Timothy Medina, male   DOB: May 07, 1960, 53 y.o.   MRN: [id number]  HPI Referred to our office by Saverio Danker PA for colonoscopy with [facility] in [place].  Recent hx of diverticulitis with abscess.  Hx of recurrent diverticulitis. He apparently had 4 episodes last year. 1st of December seen by Dr. Sharlett Iles and started on Augmentin x 14 days.  Saw Dr,.Sharlett Iles on December 22 and was placed on Augmentin for 10 days.  Presently taking Augmentin BID till his surgery.  He has not been able to take Cipro and Flagyl.  Appetite is not good.  He does feel better since his discharge. He has lost about 15 pounds in the last year. Stools are loose. He is taking Miralax. He tells me with the antibiotics he continues to hurt in his left lower quadrant. He also c/o pain in his lower back. He has seen Dr. Zella Richer in the past for elective colon surgery for his diverticulitis but patient declined by Dr. Bertrum Sol notes.    03/15/2013 CT abdomen/pelvis with CM: IMPRESSION:  1. Residual or recurrent sigmoid diverticulitis. Suspect a  developing abscess either within or immediately adjacent to the  sigmoid. This is likely not percutaneously drainable, given its size  and position.   2. Given the extent of focal colonic wall thickening, consider  clinical and possibly colonoscopy follow-up to exclude underlying carcinoma.  01/22/2013 CT abdomen/pelvis with CM: IMPRESSION:  Moderate sigmoid diverticulitis. No evidence of abscess or other  complication.  04/26/2011 Colonoscopy Dr. Sharlett Iles: Diverticulosis, mild with left sided diverticulosis. Internal hemorrhoids. CBC    Component Value Date/Time   WBC 4.9 03/19/2013 0530   RBC 4.31 03/19/2013 0530   HGB 12.5* 03/19/2013 0530   HCT 37.1* 03/19/2013 0530   PLT 191 03/19/2013 0530   MCV 86.1 03/19/2013 0530   MCH 29.0 03/19/2013 0530   MCHC 33.7 03/19/2013 0530   RDW 13.4 03/19/2013 0530   LYMPHSABS 0.7  03/15/2013 1617   MONOABS 1.4* 03/15/2013 1617   EOSABS 0.0 03/15/2013 1617   BASOSABS 0.0 03/15/2013 1617      Review of Systems see hpi Current Outpatient Prescriptions  Medication Sig Dispense Refill  . amoxicillin-clavulanate (AUGMENTIN) 875-125 MG per tablet Take 1 tablet by mouth 2 (two) times daily.  28 tablet  3  . ciprofloxacin (CIPRO) 500 MG tablet Take 1 tablet (500 mg total) by mouth 2 (two) times daily.  28 tablet  3  . metroNIDAZOLE (FLAGYL) 500 MG tablet Take 1 tablet (500 mg total) by mouth every 8 (eight) hours.  42 tablet  3  . ondansetron (ZOFRAN) 8 MG tablet Take 8 mg by mouth every 8 (eight) hours as needed for nausea or vomiting.      . traMADol (ULTRAM) 50 MG tablet Take 50 mg by mouth once.       No current facility-administered medications for this visit.   Past Medical History  Diagnosis Date  . Insomnia   . GERD (gastroesophageal reflux disease)   . Diverticulitis   . Interstitial cystitis   . Personal history of colonic polyps 03/01/2005    adenomatous   . External hemorrhoids without mention of complication   . Family history of malignant neoplasm of gastrointestinal tract    Past Surgical History  Procedure Laterality Date  . Colonoscopy          Objective:   Physical Exam There were no vitals filed for this visit. Filed Vitals:  04/03/13 1132  BP: 90/58  Pulse: 68  Temp: 98.2 F (36.8 C)  Height: 5\' 7"  (1.702 m)  Weight: 152 lb 14.4 oz (69.355 kg)  Alert and oriented. Skin warm and dry. Oral mucosa is moist.   . Sclera anicteric, conjunctivae is pink. Thyroid not enlarged. No cervical lymphadenopathy. Lungs clear. Heart regular rate and rhythm.  Abdomen is soft. Bowel sounds are positive. No hepatomegaly. No abdominal masses felt. Tenderness left lower quadrant. .  No edema to lower extremities.      Assessment:    Diverticulitis with abscess. Presently taking Augmentin BID. Dr. Laural Golden in room with patient.     Plan:    CT abdomen/pelvis  with CM around 04/16/2013. If normal Colonoscopy with Dr. Laural Golden

## 2013-04-18 ENCOUNTER — Ambulatory Visit (HOSPITAL_COMMUNITY)
Admission: RE | Admit: 2013-04-18 | Discharge: 2013-04-18 | Disposition: A | Discharge status: Home / Self Care | Payer: BC Managed Care – PPO | Source: Ambulatory Visit | Attending: Internal Medicine | Admitting: Internal Medicine

## 2013-04-18 DIAGNOSIS — Z800 Family history of malignant neoplasm of digestive organs: Secondary | ICD-10-CM | POA: Insufficient documentation

## 2013-04-18 DIAGNOSIS — K5732 Diverticulitis of large intestine without perforation or abscess without bleeding: Secondary | ICD-10-CM

## 2013-04-18 DIAGNOSIS — K5730 Diverticulosis of large intestine without perforation or abscess without bleeding: Secondary | ICD-10-CM | POA: Insufficient documentation

## 2013-04-18 DIAGNOSIS — Z01818 Encounter for other preprocedural examination: Secondary | ICD-10-CM | POA: Insufficient documentation

## 2013-04-18 DIAGNOSIS — Z792 Long term (current) use of antibiotics: Secondary | ICD-10-CM | POA: Insufficient documentation

## 2013-04-18 MED ORDER — IOHEXOL 300 MG/ML  SOLN
100.0000 mL | Freq: Once | INTRAMUSCULAR | Status: AC | PRN
  Administered 2013-04-18: 100 mL via INTRAVENOUS

## 2013-04-23 ENCOUNTER — Other Ambulatory Visit (INDEPENDENT_AMBULATORY_CARE_PROVIDER_SITE_OTHER): Payer: Self-pay | Admitting: *Deleted

## 2013-04-23 ENCOUNTER — Telehealth (INDEPENDENT_AMBULATORY_CARE_PROVIDER_SITE_OTHER): Payer: Self-pay | Admitting: *Deleted

## 2013-04-23 DIAGNOSIS — Z86010 Personal history of colonic polyps: Secondary | ICD-10-CM

## 2013-04-23 DIAGNOSIS — K5792 Diverticulitis of intestine, part unspecified, without perforation or abscess without bleeding: Secondary | ICD-10-CM

## 2013-04-23 DIAGNOSIS — Z1211 Encounter for screening for malignant neoplasm of colon: Secondary | ICD-10-CM

## 2013-04-23 MED ORDER — PEG-KCL-NACL-NASULF-NA ASC-C 100 G PO SOLR: 1.0000 | Freq: Once | ORAL | Status: DC

## 2013-04-23 NOTE — Telephone Encounter (Signed)
Patient needs movi prep 

## 2013-05-07 ENCOUNTER — Encounter (HOSPITAL_COMMUNITY): Payer: Self-pay | Admitting: Pharmacy Technician

## 2013-05-17 ENCOUNTER — Encounter (HOSPITAL_COMMUNITY): Payer: Self-pay

## 2013-05-17 ENCOUNTER — Encounter (HOSPITAL_COMMUNITY)
Admission: RE | Disposition: A | Discharge status: Home / Self Care | Payer: Self-pay | Source: Ambulatory Visit | Attending: Internal Medicine

## 2013-05-17 ENCOUNTER — Ambulatory Visit (HOSPITAL_COMMUNITY)
Admission: RE | Admit: 2013-05-17 | Discharge: 2013-05-17 | Disposition: A | Discharge status: Home / Self Care | Payer: BC Managed Care – PPO | Source: Ambulatory Visit | Attending: Internal Medicine | Admitting: Internal Medicine

## 2013-05-17 DIAGNOSIS — Z8719 Personal history of other diseases of the digestive system: Secondary | ICD-10-CM

## 2013-05-17 DIAGNOSIS — K5730 Diverticulosis of large intestine without perforation or abscess without bleeding: Secondary | ICD-10-CM | POA: Insufficient documentation

## 2013-05-17 DIAGNOSIS — D126 Benign neoplasm of colon, unspecified: Secondary | ICD-10-CM

## 2013-05-17 DIAGNOSIS — Z800 Family history of malignant neoplasm of digestive organs: Secondary | ICD-10-CM | POA: Insufficient documentation

## 2013-05-17 DIAGNOSIS — K5792 Diverticulitis of intestine, part unspecified, without perforation or abscess without bleeding: Secondary | ICD-10-CM

## 2013-05-17 DIAGNOSIS — D128 Benign neoplasm of rectum: Secondary | ICD-10-CM | POA: Insufficient documentation

## 2013-05-17 DIAGNOSIS — Z860100 Personal history of colon polyps, unspecified: Secondary | ICD-10-CM | POA: Insufficient documentation

## 2013-05-17 DIAGNOSIS — D129 Benign neoplasm of anus and anal canal: Principal | ICD-10-CM

## 2013-05-17 DIAGNOSIS — K5732 Diverticulitis of large intestine without perforation or abscess without bleeding: Secondary | ICD-10-CM | POA: Insufficient documentation

## 2013-05-17 HISTORY — PX: COLONOSCOPY: SHX5424

## 2013-05-17 SURGERY — COLONOSCOPY
Anesthesia: Moderate Sedation

## 2013-05-17 MED ORDER — FENTANYL CITRATE 0.05 MG/ML IJ SOLN
INTRAMUSCULAR | Status: AC
  Filled 2013-05-17: qty 2

## 2013-05-17 MED ORDER — FENTANYL CITRATE 0.05 MG/ML IJ SOLN
INTRAMUSCULAR | Status: DC
Start: 2013-05-17 — End: 2013-05-17
  Filled 2013-05-17: qty 2

## 2013-05-17 MED ORDER — MIDAZOLAM HCL 5 MG/5ML IJ SOLN
INTRAMUSCULAR | Status: DC | PRN
  Administered 2013-05-17 (×4): 2 mg via INTRAVENOUS

## 2013-05-17 MED ORDER — MIDAZOLAM HCL 5 MG/5ML IJ SOLN
INTRAMUSCULAR | Status: AC
  Filled 2013-05-17: qty 10

## 2013-05-17 MED ORDER — STERILE WATER FOR IRRIGATION IR SOLN
Status: DC | PRN
  Administered 2013-05-17: 12:00:00

## 2013-05-17 MED ORDER — SODIUM CHLORIDE 0.9 % IV SOLN
INTRAVENOUS | Status: DC
  Administered 2013-05-17: 11:00:00 via INTRAVENOUS

## 2013-05-17 MED ORDER — FENTANYL CITRATE 0.05 MG/ML IJ SOLN
INTRAMUSCULAR | Status: DC | PRN
  Administered 2013-05-17 (×4): 25 ug via INTRAVENOUS

## 2013-05-17 NOTE — Op Note (Signed)
COLONOSCOPY PROCEDURE REPORT  PATIENT:  Timothy Medina  MR#:  [id number] Birthdate:  [birth year], 53 y.o., male Endoscopist:  Dr. Rogene Houston, MD Referred By:  Shelly Coss, PAC/ Dr. Jackolyn Confer, MD  Procedure Date: 05/17/2013  Procedure:   Colonoscopy  Indications: Patient is 53 year old Caucasian male with history of recurrent sigmoid diverticulitis who is considering surgical intervention. He is therefore undergoing colonoscopy to rule out other conditions. He has history of colonic adenomas dating back to 2001 but none were found on his last colonoscopy 2 years ago. Patient has no abdominal pain and his abdominal exam is normal. He is still on Augmentin.  Informed Consent:  The procedure and risks were reviewed with the patient and informed consent was obtained.  Medications:  Fentanyl 100 mcg IV Versed 8 mg IV  Description of procedure:  After a digital rectal exam was performed, that colonoscope was advanced from the anus through the rectum and colon to the area of the cecum, ileocecal valve and appendiceal orifice. The cecum was deeply intubated. These structures were well-seen and photographed for the record. From the level of the cecum and ileocecal valve, the scope was slowly and cautiously withdrawn. The mucosal surfaces were carefully surveyed utilizing scope tip to flexion to facilitate fold flattening as needed. The scope was pulled down into the rectum where a thorough exam including retroflexion was performed.  Findings:   Prep excellent. Mucosa of cecum, ascending colon, hepatic flexure and transverse colon was normal. Few scattered diverticula noted at descending and sigmoid colon. There was a mucopurulent exudate mucosal edema and erythema involving sigmoid colon diverticulum with noncritical narrowing. 4 mm polyp cold snare from rectosigmoid junction. Normal mucosa rectum and rectosigmoid junction.   Therapeutic/Diagnostic Maneuvers Performed:  See  above  Complications:  None  Cecal Withdrawal Time:  9 minutes  Impression:  Examination performed to cecum. Scattered diverticula at descending and sigmoid colon along with changes of sigmoid diverticulitis in noncritical narrowing. 4 mm polyp cold snared from rectosigmoid junction.  Comment; Even though patient is asymptomatic and his abdominal exam is benign he still has changes of sigmoid diverticulitis with noncritical narrowing and he is deftly in need of sigmoid colon which already has been recommended by Dr. Zella Richer and associates to  Recommendations:  Standard instructions given. Patient will stay on Augmentin and continue probiotic daily. Patient advised to call should he develop abdominal pain fever or diarrhea. I will contact patient with biopsy results and further recommendations. Office visit with Dr. Orlene Plum on 05/28/2013 as planned.  REHMAN,NAJEEB U  05/17/2013 12:23 PM  CC: Dr. Kathlene November, MD & Dr. Rayne Du ref. provider found CC Ms. Orpah Cobb, [facility]

## 2013-05-17 NOTE — Discharge Instructions (Signed)
Resume usual medications and diet. Can take Tylenol but do not take ibuprofen Aleve or similar medications. No driving for 24 hours. Physician will call with biopsy results  Colonoscopy, Care After Refer to this sheet in the next few weeks. These instructions provide you with information on caring for yourself after your procedure. Your health care provider may also give you more specific instructions. Your treatment has been planned according to current medical practices, but problems sometimes occur. Call your health care provider if you have any problems or questions after your procedure. WHAT TO EXPECT AFTER THE PROCEDURE  After your procedure, it is typical to have the following:  A small amount of blood in your stool.  Moderate amounts of gas and mild abdominal cramping or bloating. HOME CARE INSTRUCTIONS  Do not drive, operate machinery, or sign important documents for 24 hours.  You may shower and resume your regular physical activities, but move at a slower pace for the first 24 hours.  Take frequent rest periods for the first 24 hours.  Walk around or put a warm pack on your abdomen to help reduce abdominal cramping and bloating.  Drink enough fluids to keep your urine clear or pale yellow.  You may resume your normal diet as instructed by your health care provider. Avoid heavy or fried foods that are hard to digest.  Avoid drinking alcohol for 24 hours or as instructed by your health care provider.  Only take over-the-counter or prescription medicines as directed by your health care provider.  If a tissue sample (biopsy) was taken during your procedure:  Do not take aspirin or blood thinners for 7 days, or as instructed by your health care provider.  Do not drink alcohol for 7 days, or as instructed by your health care provider.  Eat soft foods for the first 24 hours. SEEK MEDICAL CARE IF: You have persistent spotting of blood in your stool 2 3 days after the  procedure. SEEK IMMEDIATE MEDICAL CARE IF:  You have more than a small spotting of blood in your stool.  You pass large blood clots in your stool.  Your abdomen is swollen (distended).  You have nausea or vomiting.  You have a fever.  You have increasing abdominal pain that is not relieved with medicine.

## 2013-05-17 NOTE — H&P (Signed)
Timothy Medina is an 53 y.o. male.   Chief Complaint: Patient is here for colonoscopy. HPI: Patient is 53 year old Caucasian male who has history of colonic adenomas and history of recurrent diverticulitis. He was hospitalized December 2014 with diverticulitis complicated by phlegmon/abscess and responded to therapy. Since he has had multiple episodes he is interested in resection of sigmoid colon. He was therefore advised to undergo colonoscopy prior to surgery to make sure he does not have polyps or other lesions. He denies abdominal pain rectal bleeding or melena. Patient has undergone multiple colonoscopies since 2001 and he has had tubular adenomas removed on at least 3 different occasions but none on his last colonoscopy of March 2013. MAC is negative for CRC to  Past Medical History  Diagnosis Date  . Insomnia   . GERD (gastroesophageal reflux disease)   . Diverticulitis   . Interstitial cystitis   . Personal history of colonic polyps 03/01/2005    adenomatous   . External hemorrhoids without mention of complication   . Family history of malignant neoplasm of gastrointestinal tract     Past Surgical History  Procedure Laterality Date  . Colonoscopy      Family History  Problem Relation Age of Onset  . Hypertension Mother   . Diabetes Neg Hx   . Heart disease Neg Hx   . Esophageal cancer Neg Hx   . Stomach cancer Neg Hx   . Colon cancer Other     several fam members-pat side  . Alzheimer's disease Other     several fam member   . Prostate cancer Other     ?cousin   Social History:  reports that he has never smoked. He has never used smokeless tobacco. He reports that he does not drink alcohol or use illicit drugs.  Allergies:  Allergies  Allergen Reactions  . Meperidine Hcl     REACTION: needed Benadryl for hives    Medications Prior to Admission  Medication Sig Dispense Refill  . amoxicillin-clavulanate (AUGMENTIN) 875-125 MG per tablet Take 2 tablets by mouth 2  (two) times daily.      . diphenhydrAMINE (SOMINEX) 25 MG tablet Take 50 mg by mouth at bedtime as needed for sleep.      . peg 3350 powder (MOVIPREP) 100 G SOLR Take 1 kit (200 g total) by mouth once.  1 kit  0    No results found for this or any previous visit (from the past 48 hour(s)). No results found.  ROS  Blood pressure 109/66, temperature 98 F (36.7 C), temperature source Oral, resp. rate 16, height $RemoveBe'5\' 7"'OgREeuVBq$  (1.702 m), weight 150 lb (68.04 kg). Physical Exam  Constitutional:  Well-developed thin Caucasian male in NAD  HENT:  Mouth/Throat: Oropharynx is clear and moist.  Eyes: Conjunctivae are normal. No scleral icterus.  Neck: No thyromegaly present.  Cardiovascular: Normal rate, regular rhythm and normal heart sounds.   No murmur heard. Respiratory: Effort normal and breath sounds normal.  GI: Soft. He exhibits no distension and no mass. There is no tenderness.  Musculoskeletal: He exhibits no edema.  Lymphadenopathy:    He has no cervical adenopathy.  Neurological: He is alert.  Skin: Skin is warm and dry.     Assessment/Plan History of recurrent sigmoid diverticulitis. History of colonic adenomas. Diagnostic colonoscopy prior to surgical intervention.  Mildred Tuccillo U 05/17/2013, 11:37 AM

## 2013-05-22 ENCOUNTER — Encounter (HOSPITAL_COMMUNITY): Payer: Self-pay | Admitting: Internal Medicine

## 2013-05-22 ENCOUNTER — Other Ambulatory Visit (INDEPENDENT_AMBULATORY_CARE_PROVIDER_SITE_OTHER): Payer: Self-pay | Admitting: General Surgery

## 2013-05-23 ENCOUNTER — Other Ambulatory Visit (INDEPENDENT_AMBULATORY_CARE_PROVIDER_SITE_OTHER): Payer: Self-pay | Admitting: *Deleted

## 2013-05-23 ENCOUNTER — Encounter (INDEPENDENT_AMBULATORY_CARE_PROVIDER_SITE_OTHER): Payer: Self-pay | Admitting: *Deleted

## 2013-05-23 ENCOUNTER — Telehealth (INDEPENDENT_AMBULATORY_CARE_PROVIDER_SITE_OTHER): Payer: Self-pay | Admitting: *Deleted

## 2013-05-23 MED ORDER — AMOXICILLIN-POT CLAVULANATE 875-125 MG PO TABS: 1.0000 | ORAL_TABLET | Freq: Two times a day (BID) | ORAL | Status: DC

## 2013-05-23 NOTE — Telephone Encounter (Signed)
A refill request was sent to Dr.Rehman. 

## 2013-05-23 NOTE — Telephone Encounter (Signed)
Timothy Medina was told by Dr. Laural Golden he would refill his AUGMENTIN at the time of his TCS. Please send this in to [facility]. If any questions, please give him a return call at [phone] or  Ailene Ravel at [phone].

## 2013-05-23 NOTE — Telephone Encounter (Signed)
Rec'd a call stating that the Augmentin was going to be refilled at the time of his procedure. [facility] does not have it.

## 2013-05-28 ENCOUNTER — Ambulatory Visit (INDEPENDENT_AMBULATORY_CARE_PROVIDER_SITE_OTHER): Payer: BC Managed Care – PPO | Admitting: General Surgery

## 2013-05-28 ENCOUNTER — Encounter (INDEPENDENT_AMBULATORY_CARE_PROVIDER_SITE_OTHER): Payer: Self-pay | Admitting: General Surgery

## 2013-05-28 VITALS — BP 124/72 | HR 75 | Temp 97.2°F | Resp 16 | Ht 67.0 in | Wt 153.0 lb

## 2013-05-28 DIAGNOSIS — K5732 Diverticulitis of large intestine without perforation or abscess without bleeding: Secondary | ICD-10-CM

## 2013-05-28 NOTE — Progress Notes (Signed)
Patient ID: Timothy Medina, male   DOB: [birth year], 53 y.o.   MRN: [id number]  Chief Complaint  Patient presents with  . Diverticulitis    new pt    HPI Timothy Medina is a 53 y.o. male.   HPI  He is well known to our practice. He has had recurring bouts of sigmoid diverticulitis. A partial colectomy has been recommended in the past and that he did not want to have that done at that time. He was hospitalized earlier this year for another bout of diverticulitis. He's been on suppressive antibiotics since then. Colonoscopy demonstrated an inflamed area in the sigmoid colon consistent with the CT scan results. Dr. Laural Golden has recommended he reconsider the surgery and he is here but because of that.  He currently is pain and symptom free.  Past Medical History  Diagnosis Date  . Insomnia   . GERD (gastroesophageal reflux disease)   . Diverticulitis   . Interstitial cystitis   . Personal history of colonic polyps 03/01/2005    adenomatous   . External hemorrhoids without mention of complication   . Family history of malignant neoplasm of gastrointestinal tract     Past Surgical History  Procedure Laterality Date  . Colonoscopy    . Colonoscopy N/A 05/17/2013    Procedure: COLONOSCOPY;  Surgeon: Rogene Houston, MD;  Location: [facility];  Service: Endoscopy;  Laterality: N/A;  1200    Family History  Problem Relation Age of Onset  . Hypertension Mother   . Diabetes Neg Hx   . Heart disease Neg Hx   . Esophageal cancer Neg Hx   . Stomach cancer Neg Hx   . Colon cancer Other     several fam members-pat side  . Alzheimer's disease Other     several fam member   . Prostate cancer Other     ?cousin    Social History History  Substance Use Topics  . Smoking status: Never Smoker   . Smokeless tobacco: Never Used  . Alcohol Use: No    Allergies  Allergen Reactions  . Meperidine Hcl     REACTION: needed Benadryl for hives    Current Outpatient Prescriptions  Medication  Sig Dispense Refill  . amoxicillin-clavulanate (AUGMENTIN) 875-125 MG per tablet Take 1 tablet by mouth 2 (two) times daily.  30 tablet  0  . diphenhydrAMINE (SOMINEX) 25 MG tablet Take 50 mg by mouth at bedtime as needed for sleep.       No current facility-administered medications for this visit.    Review of Systems Review of Systems  Constitutional: Negative.   Respiratory: Negative.   Cardiovascular: Negative.   Gastrointestinal: Negative.   Genitourinary: Negative.   Neurological: Negative.   Hematological: Negative.     Blood pressure 124/72, pulse 75, temperature 97.2 F (36.2 C), temperature source Oral, resp. rate 16, height 5\' 7"  (1.702 m), weight 153 lb (69.4 kg).  Physical Exam Physical Exam  Constitutional: He appears well-developed and well-nourished. No distress.  HENT:  Head: Normocephalic and atraumatic.  Cardiovascular: Normal rate and regular rhythm.   Pulmonary/Chest: Effort normal and breath sounds normal.  Abdominal: Soft. He exhibits no distension and no mass. There is no tenderness.  Musculoskeletal: He exhibits no edema.  Lymphadenopathy:    He has no cervical adenopathy.  Neurological: He is alert.  Skin: Skin is warm and dry.  Psychiatric: He has a normal mood and affect. His behavior is normal.    Data Reviewed  Notes in EPIC.  Colonoscopy report.  Assessment    Recurrent sigmoid diverticulitis. Currently asymptomatic on suppressive therapy. I recommended a laparoscopic assisted partial colectomy. He is in agreement with this.     Plan    Laparoscopic-assisted partial colectomy.  I have explained the procedure and risks of colon resection.  Risks include but are not limited to bleeding, infection, wound problems, anesthesia, anastomotic leak, need for colostomy, injury to intraabominal organs (such as intestine, spleen, kidney, bladder, ureter, etc.), ileus, irregular bowel habits.  He seems to understand and agrees to proceed.         Ninetta Adelstein J 05/28/2013, 12:04 PM

## 2013-05-28 NOTE — Patient Instructions (Signed)
[facility]  ONE-DAY (1) PRE-OP HOME COLON PREP INSTRUCTIONS: ** MIRALAX / GATORADE PREP **  Fill the two prescriptions at a pharmacy of your choice.  You must follow the instructions below carefully.  If you have questions or problems, please call and speak to someone in the clinic department at our office:   [phone].  MIRALAX - GATORADE -- DULCOLAX TABS:   Fill the prescriptions for MIRALAX  (255 gm bottle)    In addition, purchase four (4) DULCOLAX TABLETS (no prescription required), and one 64 oz GATORADE.  (Do NOT purchase red Gatorade; any other flavor is acceptable).  ANITIBIOTICS:   There will be 2 different antibiotics.     Take both prescriptions THE AFTERNOON BEFORE your surgery, at the times written on the bottles.  INSTRUCTIONS: 1. Five days prior to your procedure do not eat nuts, popcorn, or fruit with seeds.  Stop all fiber supplements such as Metamucil, Citrucel, etc.  2. The day before your procedure: o 6:00am:  take (4) Dulcolax tablets.  You should remain on clear liquids for the entire day.   CLEAR LIQUIDS: clear bouillon, broth, jello (NOT RED), black coffee, tea, soda, etc o 10:00am:  add the bottle of MiraLax to the 64-oz bottle of Gatorade, and dissolve.  Begin drinking the Gatorade mixture until gone (8 oz every 15-30 minutes).  Continue clear liquids until midnight (or bedtime). o Take the antibiotics at the times instructed on the bottles.  3. The day of your procedure:   Do not eat or drink ANYTHING after midnight before your surgery.     If you take Heart or Blood Pressure medicine, ask the pre-op nurses about these during your preop appointment.   Further pre-operative instructions will be given to you from the hospital.   Expect to be contacted 5-7 days before your surgery.       

## 2013-05-29 NOTE — Telephone Encounter (Signed)
Does this patient need this Rx. Per kelly wanted me to ask you. Patient is having surgery on 06-26-2013

## 2013-06-08 ENCOUNTER — Telehealth (INDEPENDENT_AMBULATORY_CARE_PROVIDER_SITE_OTHER): Payer: Self-pay | Admitting: General Surgery

## 2013-06-08 NOTE — Telephone Encounter (Signed)
Patient's girlfriend called explaining that the patient will run out of his Augmentin this weekend and that Dr. Zella Richer wanted him to stay on it until his surgery in may.  She wanted to know if Dr. Zella Richer would be willing to refill this.  Dr. Melony Overly was the original signer of this medication but his office is closed today.

## 2013-06-08 NOTE — Telephone Encounter (Signed)
Refill it please.

## 2013-06-11 NOTE — Patient Instructions (Signed)
Timothy Medina  06/11/2013   Your procedure is scheduled on:  06/26/2013  0730am-1030am  Report to [place] at      0500 AM.  Call this number if you have problems the morning of surgery: [phone]   Remember:   Do not eat food or drink liquids after midnight.   Take these medicines the morning of surgery with A SIP OF WATER:    Do not wear jewelry,   Do not wear lotions, powders, or perfumes.   . Men may shave face and neck.  Do not bring valuables to the hospital.  Contacts, dentures or bridgework may not be worn into surgery.  Leave suitcase in the car. After surgery it may be brought to your room.  For patients admitted to the hospital, checkout time is 11:00 AM the day of  discharge.     [place] [place] - Preparing for Surgery Before surgery, you can play an important role.  Because skin is not sterile, your skin needs to be as free of germs as possible.  You can reduce the number of germs on your skin by washing with CHG (chlorahexidine gluconate) soap before surgery.  CHG is an antiseptic cleaner which kills germs and bonds with the skin to continue killing germs even after washing. Please DO NOT use if you have an allergy to CHG or antibacterial soaps.  If your skin becomes reddened/irritated stop using the CHG and inform your nurse when you arrive at Short Stay. Do not shave (including legs and underarms) for at least 48 hours prior to the first CHG shower.  You may shave your face. Please follow these instructions carefully:  1.  Shower with CHG Soap the night before surgery and the  morning of Surgery.  2.  If you choose to wash your hair, wash your hair first as usual with your  normal  shampoo.  3.  After you shampoo, rinse your hair and body thoroughly to remove the  shampoo.                           4.  Use CHG as you would any other liquid soap.  You can apply chg directly  to the skin and wash                       Gently with a scrungie or clean  washcloth.  5.  Apply the CHG Soap to your body ONLY FROM THE NECK DOWN.   Do not use on open                           Wound or open sores. Avoid contact with eyes, ears mouth and genitals (private parts).                        Genitals (private parts) with your normal soap.             6.  Wash thoroughly, paying special attention to the area where your surgery  will be performed.  7.  Thoroughly rinse your body with warm water from the neck down.  8.  DO NOT shower/wash with your normal soap after using and rinsing off  the CHG Soap.                9.  Pat yourself dry with a clean  towel.            10.  Wear clean pajamas.            11.  Place clean sheets on your bed the night of your first shower and do not  sleep with pets. Day of Surgery : Do not apply any lotions/deodorants the morning of surgery.  Please wear clean clothes to the hospital/surgery center.  FAILURE TO FOLLOW THESE INSTRUCTIONS MAY RESULT IN THE CANCELLATION OF YOUR SURGERY PATIENT SIGNATURE_________________________________  NURSE SIGNATURE__________________________________  WHAT IS A BLOOD TRANSFUSION? Blood Transfusion Information  A transfusion is the replacement of blood or some of its parts. Blood is made up of multiple cells which provide different functions.  Red blood cells carry oxygen and are used for blood loss replacement.  White blood cells fight against infection.  Platelets control bleeding.  Plasma helps clot blood.  Other blood products are available for specialized needs, such as hemophilia or other clotting disorders. BEFORE THE TRANSFUSION  Who gives blood for transfusions?   Healthy volunteers who are fully evaluated to make sure their blood is safe. This is blood bank blood. Transfusion therapy is the safest it has ever been in the practice of medicine. Before blood is taken from a donor, a complete history is taken to make sure that person has no history of diseases nor engages in  risky social behavior (examples are intravenous drug use or sexual activity with multiple partners). The donor's travel history is screened to minimize risk of transmitting infections, such as malaria. The donated blood is tested for signs of infectious diseases, such as HIV and hepatitis. The blood is then tested to be sure it is compatible with you in order to minimize the chance of a transfusion reaction. If you or a relative donates blood, this is often done in anticipation of surgery and is not appropriate for emergency situations. It takes many days to process the donated blood. RISKS AND COMPLICATIONS Although transfusion therapy is very safe and saves many lives, the main dangers of transfusion include:   Getting an infectious disease.  Developing a transfusion reaction. This is an allergic reaction to something in the blood you were given. Every precaution is taken to prevent this. The decision to have a blood transfusion has been considered carefully by your caregiver before blood is given. Blood is not given unless the benefits outweigh the risks. AFTER THE TRANSFUSION  Right after receiving a blood transfusion, you will usually feel much better and more energetic. This is especially true if your red blood cells have gotten low (anemic). The transfusion raises the level of the red blood cells which carry oxygen, and this usually causes an energy increase.  The nurse administering the transfusion will monitor you carefully for complications. HOME CARE INSTRUCTIONS  No special instructions are needed after a transfusion. You may find your energy is better. Speak with your caregiver about any limitations on activity for underlying diseases you may have. SEEK MEDICAL CARE IF:   Your condition is not improving after your transfusion.  You develop redness or irritation at the intravenous (IV) site. SEEK IMMEDIATE MEDICAL CARE IF:  Any of the following symptoms occur over the next 12  hours:  Shaking chills.  You have a temperature by mouth above 102 F (38.9 C), not controlled by medicine.  Chest, back, or muscle pain.  People around you feel you are not acting correctly or are confused.  Shortness of breath or difficulty breathing.  Dizziness and fainting.  You get a rash or develop hives.  You have a decrease in urine output.  Your urine turns a dark color or changes to pink, red, or brown. Any of the following symptoms occur over the next 10 days:  You have a temperature by mouth above 102 F (38.9 C), not controlled by medicine.  Shortness of breath.  Weakness after normal activity.  The white part of the eye turns yellow (jaundice).  You have a decrease in the amount of urine or are urinating less often.  Your urine turns a dark color or changes to pink, red, or brown. Document Released: 02/06/2000 Document Revised: 05/03/2011 Document Reviewed: 09/25/2007 [facility] Patient Information 2014 [place].

## 2013-06-12 ENCOUNTER — Encounter (HOSPITAL_COMMUNITY): Payer: Self-pay | Admitting: Pharmacy Technician

## 2013-06-12 ENCOUNTER — Other Ambulatory Visit (INDEPENDENT_AMBULATORY_CARE_PROVIDER_SITE_OTHER): Payer: Self-pay

## 2013-06-12 MED ORDER — AMOXICILLIN-POT CLAVULANATE 875-125 MG PO TABS: 1.0000 | ORAL_TABLET | Freq: Two times a day (BID) | ORAL | Status: DC

## 2013-06-12 NOTE — Telephone Encounter (Signed)
LMOV refill e-prescribed today.  #14.  Pt's surgery date is 06/26/13.

## 2013-06-13 ENCOUNTER — Encounter (HOSPITAL_COMMUNITY): Payer: Self-pay

## 2013-06-13 ENCOUNTER — Encounter (HOSPITAL_COMMUNITY)
Admission: RE | Admit: 2013-06-13 | Discharge: 2013-06-13 | Disposition: A | Discharge status: Home / Self Care | Payer: BC Managed Care – PPO | Source: Ambulatory Visit | Attending: General Surgery | Admitting: General Surgery

## 2013-06-13 ENCOUNTER — Telehealth (INDEPENDENT_AMBULATORY_CARE_PROVIDER_SITE_OTHER): Payer: Self-pay

## 2013-06-13 DIAGNOSIS — K5732 Diverticulitis of large intestine without perforation or abscess without bleeding: Secondary | ICD-10-CM | POA: Insufficient documentation

## 2013-06-13 DIAGNOSIS — Z01812 Encounter for preprocedural laboratory examination: Secondary | ICD-10-CM | POA: Insufficient documentation

## 2013-06-13 LAB — CBC WITH DIFFERENTIAL/PLATELET
Basophils Absolute: 0 10*3/uL (ref 0.0–0.1)
Basophils Relative: 1 % (ref 0–1)
Eosinophils Absolute: 0.1 10*3/uL (ref 0.0–0.7)
Eosinophils Relative: 3 % (ref 0–5)
HEMATOCRIT: 41.7 % (ref 39.0–52.0)
HEMOGLOBIN: 14.2 g/dL (ref 13.0–17.0)
LYMPHS ABS: 0.9 10*3/uL (ref 0.7–4.0)
Lymphocytes Relative: 22 % (ref 12–46)
MCH: 29.2 pg (ref 26.0–34.0)
MCHC: 34.1 g/dL (ref 30.0–36.0)
MCV: 85.6 fL (ref 78.0–100.0)
MONOS PCT: 11 % (ref 3–12)
Monocytes Absolute: 0.4 10*3/uL (ref 0.1–1.0)
NEUTROS ABS: 2.6 10*3/uL (ref 1.7–7.7)
NEUTROS PCT: 63 % (ref 43–77)
Platelets: 166 10*3/uL (ref 150–400)
RBC: 4.87 MIL/uL (ref 4.22–5.81)
RDW: 13.8 % (ref 11.5–15.5)
WBC: 4.1 10*3/uL (ref 4.0–10.5)

## 2013-06-13 LAB — COMPREHENSIVE METABOLIC PANEL
ALK PHOS: 60 U/L (ref 39–117)
ALT: 25 U/L (ref 0–53)
AST: 20 U/L (ref 0–37)
Albumin: 3.9 g/dL (ref 3.5–5.2)
BILIRUBIN TOTAL: 0.4 mg/dL (ref 0.3–1.2)
BUN: 17 mg/dL (ref 6–23)
CHLORIDE: 104 meq/L (ref 96–112)
CO2: 24 meq/L (ref 19–32)
CREATININE: 1.01 mg/dL (ref 0.50–1.35)
Calcium: 9.4 mg/dL (ref 8.4–10.5)
GFR, EST NON AFRICAN AMERICAN: 84 mL/min — AB (ref >90)
GLUCOSE: 96 mg/dL (ref 70–99)
POTASSIUM: 4.6 meq/L (ref 3.7–5.3)
Sodium: 138 mEq/L (ref 137–147)
Total Protein: 7.1 g/dL (ref 6.0–8.3)

## 2013-06-13 LAB — PROTIME-INR
INR: 1.06 (ref 0.00–1.49)
Prothrombin Time: 13.6 seconds (ref 11.6–15.2)

## 2013-06-13 NOTE — Telephone Encounter (Signed)
Patient calling into office to see if he need's to take another round of antibiotics.  Patient scheduled for Lap assisted partial colectomy on 06/26/13.  Patient also need's estimate of his return to work date and we can mail to his home address whenit's available

## 2013-06-14 ENCOUNTER — Other Ambulatory Visit (INDEPENDENT_AMBULATORY_CARE_PROVIDER_SITE_OTHER): Payer: Self-pay

## 2013-06-14 MED ORDER — AMOXICILLIN-POT CLAVULANATE 875-125 MG PO TABS: 1.0000 | ORAL_TABLET | Freq: Two times a day (BID) | ORAL | Status: DC

## 2013-06-14 NOTE — Telephone Encounter (Signed)
LMOV pt's Augmentin refilled and sent to pharmacy.

## 2013-06-14 NOTE — Telephone Encounter (Signed)
Please give him 2 refills of the antibiotic.

## 2013-06-20 ENCOUNTER — Telehealth (INDEPENDENT_AMBULATORY_CARE_PROVIDER_SITE_OTHER): Payer: Self-pay

## 2013-06-20 ENCOUNTER — Encounter (INDEPENDENT_AMBULATORY_CARE_PROVIDER_SITE_OTHER): Payer: Self-pay

## 2013-06-20 NOTE — Telephone Encounter (Signed)
Pt presented to the office today.  Letter to release him from work for six weeks after his surgery date of 06/26/13 was given to the patient.

## 2013-06-25 ENCOUNTER — Encounter (HOSPITAL_COMMUNITY): Payer: Self-pay | Admitting: Anesthesiology

## 2013-06-25 ENCOUNTER — Telehealth (INDEPENDENT_AMBULATORY_CARE_PROVIDER_SITE_OTHER): Payer: Self-pay

## 2013-06-25 NOTE — Anesthesia Preprocedure Evaluation (Addendum)
Anesthesia Evaluation  Patient identified by MRN, date of birth, ID band Patient awake    Reviewed: Allergy & Precautions, H&P , NPO status , Patient's Chart, lab work & pertinent test results  Airway Mallampati: II TM Distance: >3 FB Neck ROM: Full    Dental  (+)    Pulmonary neg pulmonary ROS,  breath sounds clear to auscultation  Pulmonary exam normal       Cardiovascular Exercise Tolerance: Good negative cardio ROS  Rhythm:Regular Rate:Normal     Neuro/Psych  Headaches, negative psych ROS   GI/Hepatic Neg liver ROS, GERD-  ,  Endo/Other  negative endocrine ROS  Renal/GU negative Renal ROS  negative genitourinary   Musculoskeletal negative musculoskeletal ROS (+)   Abdominal   Peds negative pediatric ROS (+)  Hematology negative hematology ROS (+)   Anesthesia Other Findings   Reproductive/Obstetrics negative OB ROS                          Anesthesia Physical Anesthesia Plan  ASA: I  Anesthesia Plan: General   Post-op Pain Management:    Induction: Intravenous  Airway Management Planned: Oral ETT  Additional Equipment:   Intra-op Plan:   Post-operative Plan: Extubation in OR  Informed Consent: I have reviewed the patients History and Physical, chart, labs and discussed the procedure including the risks, benefits and alternatives for the proposed anesthesia with the patient or authorized representative who has indicated his/her understanding and acceptance.   Dental advisory given  Plan Discussed with: CRNA  Anesthesia Plan Comments:         Anesthesia Quick Evaluation

## 2013-06-25 NOTE — Telephone Encounter (Signed)
Pt scheduled for lap partial colectomy on 06/26/13. Pt was given abx to take before surgery. He took one round and then got sick on his stomach. He has done the bowl prep. Dr Zella Richer out of the office spoke with Dr Harlow Asa he states that pt doesn't need to take the other 2 abx. Informed pt and pt agrees with POC

## 2013-06-26 ENCOUNTER — Encounter (HOSPITAL_COMMUNITY)
Admission: RE | Disposition: A | Discharge status: Home / Self Care | Payer: Self-pay | Source: Ambulatory Visit | Attending: General Surgery

## 2013-06-26 ENCOUNTER — Inpatient Hospital Stay (HOSPITAL_COMMUNITY)
Admission: RE | Admit: 2013-06-26 | Discharge: 2013-06-30 | DRG: 330 | Disposition: A | Discharge status: Home / Self Care | Payer: BC Managed Care – PPO | Source: Ambulatory Visit | Attending: General Surgery | Admitting: General Surgery

## 2013-06-26 ENCOUNTER — Encounter (HOSPITAL_COMMUNITY): Payer: Self-pay | Admitting: *Deleted

## 2013-06-26 ENCOUNTER — Inpatient Hospital Stay (HOSPITAL_COMMUNITY): Payer: BC Managed Care – PPO | Admitting: Anesthesiology

## 2013-06-26 ENCOUNTER — Encounter (HOSPITAL_COMMUNITY): Payer: BC Managed Care – PPO | Admitting: Anesthesiology

## 2013-06-26 DIAGNOSIS — Z8249 Family history of ischemic heart disease and other diseases of the circulatory system: Secondary | ICD-10-CM

## 2013-06-26 DIAGNOSIS — Z860100 Personal history of colon polyps, unspecified: Secondary | ICD-10-CM

## 2013-06-26 DIAGNOSIS — K560 Paralytic ileus: Secondary | ICD-10-CM | POA: Diagnosis not present

## 2013-06-26 DIAGNOSIS — Z800 Family history of malignant neoplasm of digestive organs: Secondary | ICD-10-CM

## 2013-06-26 DIAGNOSIS — Z792 Long term (current) use of antibiotics: Secondary | ICD-10-CM

## 2013-06-26 DIAGNOSIS — K219 Gastro-esophageal reflux disease without esophagitis: Secondary | ICD-10-CM | POA: Diagnosis present

## 2013-06-26 DIAGNOSIS — K5732 Diverticulitis of large intestine without perforation or abscess without bleeding: Secondary | ICD-10-CM

## 2013-06-26 DIAGNOSIS — Z8042 Family history of malignant neoplasm of prostate: Secondary | ICD-10-CM

## 2013-06-26 DIAGNOSIS — Z01812 Encounter for preprocedural laboratory examination: Secondary | ICD-10-CM

## 2013-06-26 HISTORY — PX: LAPAROSCOPIC PARTIAL COLECTOMY: SHX5907

## 2013-06-26 LAB — TYPE AND SCREEN
ABO/RH(D): O POS
Antibody Screen: NEGATIVE

## 2013-06-26 LAB — ABO/RH: ABO/RH(D): O POS

## 2013-06-26 SURGERY — LAPAROSCOPIC PARTIAL COLECTOMY
Anesthesia: General | Site: Abdomen

## 2013-06-26 MED ORDER — DEXTROSE 5 % IV SOLN
2.0000 g | INTRAVENOUS | Status: AC
  Administered 2013-06-26: 2 g via INTRAVENOUS
  Filled 2013-06-26: qty 2

## 2013-06-26 MED ORDER — ONDANSETRON HCL 4 MG/2ML IJ SOLN: 4.0000 mg | Freq: Four times a day (QID) | INTRAMUSCULAR | Status: DC | PRN

## 2013-06-26 MED ORDER — ROCURONIUM BROMIDE 100 MG/10ML IV SOLN
INTRAVENOUS | Status: AC
  Filled 2013-06-26: qty 1

## 2013-06-26 MED ORDER — ROCURONIUM BROMIDE 100 MG/10ML IV SOLN
INTRAVENOUS | Status: DC | PRN
  Administered 2013-06-26 (×2): 10 mg via INTRAVENOUS
  Administered 2013-06-26: 70 mg via INTRAVENOUS
  Administered 2013-06-26 (×2): 10 mg via INTRAVENOUS

## 2013-06-26 MED ORDER — BUPIVACAINE-EPINEPHRINE 0.5% -1:200000 IJ SOLN
INTRAMUSCULAR | Status: DC | PRN
  Administered 2013-06-26: 10 mL

## 2013-06-26 MED ORDER — HYDROMORPHONE HCL PF 1 MG/ML IJ SOLN
INTRAMUSCULAR | Status: AC
  Filled 2013-06-26: qty 1

## 2013-06-26 MED ORDER — HYDROMORPHONE HCL PF 1 MG/ML IJ SOLN
INTRAMUSCULAR | Status: DC | PRN
  Administered 2013-06-26 (×2): 1 mg via INTRAVENOUS

## 2013-06-26 MED ORDER — HYDROMORPHONE HCL PF 2 MG/ML IJ SOLN
INTRAMUSCULAR | Status: AC
  Filled 2013-06-26: qty 1

## 2013-06-26 MED ORDER — ACETAMINOPHEN 10 MG/ML IV SOLN
INTRAVENOUS | Status: DC | PRN
  Administered 2013-06-26: 1000 mg via INTRAVENOUS

## 2013-06-26 MED ORDER — SODIUM CHLORIDE 0.9 % IJ SOLN
INTRAMUSCULAR | Status: AC
  Filled 2013-06-26: qty 10

## 2013-06-26 MED ORDER — ONDANSETRON HCL 4 MG PO TABS: 4.0000 mg | ORAL_TABLET | Freq: Four times a day (QID) | ORAL | Status: DC | PRN

## 2013-06-26 MED ORDER — NEOSTIGMINE METHYLSULFATE 10 MG/10ML IV SOLN
INTRAVENOUS | Status: DC | PRN
  Administered 2013-06-26: 4 mg via INTRAVENOUS

## 2013-06-26 MED ORDER — MIDAZOLAM HCL 2 MG/2ML IJ SOLN
INTRAMUSCULAR | Status: AC
  Filled 2013-06-26: qty 2

## 2013-06-26 MED ORDER — SUFENTANIL CITRATE 50 MCG/ML IV SOLN
INTRAVENOUS | Status: AC
  Filled 2013-06-26: qty 1

## 2013-06-26 MED ORDER — HYDROMORPHONE HCL PF 1 MG/ML IJ SOLN
0.2500 mg | INTRAMUSCULAR | Status: DC | PRN
  Administered 2013-06-26: 0.25 mg via INTRAVENOUS
  Administered 2013-06-26: 0.5 mg via INTRAVENOUS
  Administered 2013-06-26: 0.25 mg via INTRAVENOUS

## 2013-06-26 MED ORDER — ACETAMINOPHEN 10 MG/ML IV SOLN
1000.0000 mg | Freq: Once | INTRAVENOUS | Status: DC
  Filled 2013-06-26: qty 100

## 2013-06-26 MED ORDER — DEXTROSE 5 % IV SOLN
2.0000 g | Freq: Two times a day (BID) | INTRAVENOUS | Status: AC
  Administered 2013-06-26: 2 g via INTRAVENOUS
  Filled 2013-06-26: qty 2

## 2013-06-26 MED ORDER — MIDAZOLAM HCL 5 MG/5ML IJ SOLN
INTRAMUSCULAR | Status: DC | PRN
  Administered 2013-06-26 (×2): 2 mg via INTRAVENOUS

## 2013-06-26 MED ORDER — DEXAMETHASONE SODIUM PHOSPHATE 10 MG/ML IJ SOLN
INTRAMUSCULAR | Status: AC
  Filled 2013-06-26: qty 1

## 2013-06-26 MED ORDER — PROPOFOL 10 MG/ML IV BOLUS
INTRAVENOUS | Status: DC | PRN
  Administered 2013-06-26: 200 mg via INTRAVENOUS

## 2013-06-26 MED ORDER — CEFOTETAN DISODIUM-DEXTROSE 2-2.08 GM-% IV SOLR
INTRAVENOUS | Status: AC
  Filled 2013-06-26: qty 50

## 2013-06-26 MED ORDER — DEXTROSE IN LACTATED RINGERS 5 % IV SOLN
INTRAVENOUS | Status: DC
  Administered 2013-06-26 – 2013-06-29 (×8): via INTRAVENOUS

## 2013-06-26 MED ORDER — NEOSTIGMINE METHYLSULFATE 10 MG/10ML IV SOLN
INTRAVENOUS | Status: AC
  Filled 2013-06-26: qty 1

## 2013-06-26 MED ORDER — LIDOCAINE HCL (CARDIAC) 20 MG/ML IV SOLN
INTRAVENOUS | Status: AC
  Filled 2013-06-26: qty 5

## 2013-06-26 MED ORDER — ALVIMOPAN 12 MG PO CAPS
12.0000 mg | ORAL_CAPSULE | Freq: Once | ORAL | Status: AC
  Administered 2013-06-26: 12 mg via ORAL
  Filled 2013-06-26: qty 1

## 2013-06-26 MED ORDER — ONDANSETRON HCL 4 MG/2ML IJ SOLN
INTRAMUSCULAR | Status: AC
  Filled 2013-06-26: qty 2

## 2013-06-26 MED ORDER — PROPOFOL 10 MG/ML IV BOLUS
INTRAVENOUS | Status: AC
  Filled 2013-06-26: qty 20

## 2013-06-26 MED ORDER — DIPHENHYDRAMINE HCL 12.5 MG/5ML PO ELIX: 12.5000 mg | ORAL_SOLUTION | Freq: Four times a day (QID) | ORAL | Status: DC | PRN

## 2013-06-26 MED ORDER — MORPHINE SULFATE (PF) 1 MG/ML IV SOLN
INTRAVENOUS | Status: AC
  Filled 2013-06-26: qty 25

## 2013-06-26 MED ORDER — SUFENTANIL CITRATE 50 MCG/ML IV SOLN
INTRAVENOUS | Status: DC | PRN
  Administered 2013-06-26 (×2): 10 ug via INTRAVENOUS
  Administered 2013-06-26: 5 ug via INTRAVENOUS
  Administered 2013-06-26: 15 ug via INTRAVENOUS
  Administered 2013-06-26 (×2): 10 ug via INTRAVENOUS
  Administered 2013-06-26: 5 ug via INTRAVENOUS
  Administered 2013-06-26: 10 ug via INTRAVENOUS
  Administered 2013-06-26: 5 ug via INTRAVENOUS
  Administered 2013-06-26 (×2): 10 ug via INTRAVENOUS

## 2013-06-26 MED ORDER — GLYCOPYRROLATE 0.2 MG/ML IJ SOLN
INTRAMUSCULAR | Status: DC | PRN
  Administered 2013-06-26: 0.6 mg via INTRAVENOUS

## 2013-06-26 MED ORDER — ALVIMOPAN 12 MG PO CAPS
12.0000 mg | ORAL_CAPSULE | Freq: Two times a day (BID) | ORAL | Status: DC
  Administered 2013-06-27 – 2013-06-29 (×5): 12 mg via ORAL
  Filled 2013-06-26 (×6): qty 1

## 2013-06-26 MED ORDER — LIDOCAINE HCL (CARDIAC) 20 MG/ML IV SOLN
INTRAVENOUS | Status: DC | PRN
Start: 2013-06-26 — End: 2013-06-26
  Administered 2013-06-26: 75 mg via INTRAVENOUS
  Administered 2013-06-26: 25 mg via INTRATRACHEAL

## 2013-06-26 MED ORDER — PANTOPRAZOLE SODIUM 40 MG IV SOLR
40.0000 mg | INTRAVENOUS | Status: DC
  Administered 2013-06-26 – 2013-06-29 (×5): 40 mg via INTRAVENOUS
  Filled 2013-06-26 (×6): qty 40

## 2013-06-26 MED ORDER — DIPHENHYDRAMINE HCL 50 MG/ML IJ SOLN
12.5000 mg | Freq: Four times a day (QID) | INTRAMUSCULAR | Status: DC | PRN
  Administered 2013-06-26: 12.5 mg via INTRAVENOUS
  Filled 2013-06-26: qty 1

## 2013-06-26 MED ORDER — ONDANSETRON HCL 4 MG/2ML IJ SOLN
INTRAMUSCULAR | Status: DC | PRN
  Administered 2013-06-26: 4 mg via INTRAVENOUS

## 2013-06-26 MED ORDER — PROMETHAZINE HCL 25 MG/ML IJ SOLN
6.2500 mg | INTRAMUSCULAR | Status: DC | PRN
  Administered 2013-06-26: 6.25 mg via INTRAVENOUS

## 2013-06-26 MED ORDER — GLYCOPYRROLATE 0.2 MG/ML IJ SOLN
INTRAMUSCULAR | Status: AC
  Filled 2013-06-26: qty 3

## 2013-06-26 MED ORDER — HEPARIN SODIUM (PORCINE) 5000 UNIT/ML IJ SOLN
5000.0000 [IU] | Freq: Three times a day (TID) | INTRAMUSCULAR | Status: DC
  Administered 2013-06-27 – 2013-06-30 (×10): 5000 [IU] via SUBCUTANEOUS
  Filled 2013-06-26 (×13): qty 1

## 2013-06-26 MED ORDER — LACTATED RINGERS IV SOLN
INTRAVENOUS | Status: DC | PRN
  Administered 2013-06-26 (×4): via INTRAVENOUS

## 2013-06-26 MED ORDER — BUPIVACAINE-EPINEPHRINE (PF) 0.5% -1:200000 IJ SOLN
INTRAMUSCULAR | Status: AC
  Filled 2013-06-26: qty 30

## 2013-06-26 MED ORDER — SODIUM CHLORIDE 0.9 % IJ SOLN: 9.0000 mL | INTRAMUSCULAR | Status: DC | PRN

## 2013-06-26 MED ORDER — NALOXONE HCL 0.4 MG/ML IJ SOLN: 0.4000 mg | INTRAMUSCULAR | Status: DC | PRN

## 2013-06-26 MED ORDER — DEXAMETHASONE SODIUM PHOSPHATE 10 MG/ML IJ SOLN
INTRAMUSCULAR | Status: DC | PRN
  Administered 2013-06-26: 10 mg via INTRAVENOUS

## 2013-06-26 MED ORDER — LACTATED RINGERS IR SOLN
Status: DC | PRN
  Administered 2013-06-26: 1000 mL

## 2013-06-26 MED ORDER — PROMETHAZINE HCL 25 MG/ML IJ SOLN
INTRAMUSCULAR | Status: AC
  Filled 2013-06-26: qty 1

## 2013-06-26 MED ORDER — ONDANSETRON HCL 4 MG/2ML IJ SOLN
4.0000 mg | INTRAMUSCULAR | Status: DC | PRN
  Administered 2013-06-26 – 2013-06-30 (×7): 4 mg via INTRAVENOUS
  Filled 2013-06-26 (×7): qty 2

## 2013-06-26 MED ORDER — MORPHINE SULFATE (PF) 1 MG/ML IV SOLN
INTRAVENOUS | Status: DC
  Administered 2013-06-26: 16:00:00 via INTRAVENOUS
  Administered 2013-06-26: 19 mg via INTRAVENOUS
  Administered 2013-06-26: 3 mg via INTRAVENOUS
  Administered 2013-06-26: 14 mg via INTRAVENOUS
  Administered 2013-06-26 – 2013-06-27 (×2): via INTRAVENOUS
  Administered 2013-06-27: 22 mg via INTRAVENOUS
  Administered 2013-06-27: 8 mg via INTRAVENOUS
  Administered 2013-06-27: 3 mg via INTRAVENOUS
  Administered 2013-06-27: 10 mg via INTRAVENOUS
  Administered 2013-06-27: 14 mg via INTRAVENOUS
  Administered 2013-06-27: 10 mg via INTRAVENOUS
  Administered 2013-06-27: 15:00:00 via INTRAVENOUS
  Administered 2013-06-28: 17.99 mg via INTRAVENOUS
  Filled 2013-06-26 (×5): qty 25

## 2013-06-26 SURGICAL SUPPLY — 85 items
APPLIER CLIP 5 13 M/L LIGAMAX5 (MISCELLANEOUS)
APPLIER CLIP ROT 10 11.4 M/L (STAPLE)
APR CLP MED LRG 11.4X10 (STAPLE)
APR CLP MED LRG 5 ANG JAW (MISCELLANEOUS)
BLADE EXTENDED COATED 6.5IN (ELECTRODE) ×2 IMPLANT
BLADE HEX COATED 2.75 (ELECTRODE) ×2 IMPLANT
BLADE SURG SZ10 CARB STEEL (BLADE) ×3 IMPLANT
CABLE HIGH FREQUENCY MONO STRZ (ELECTRODE) ×3 IMPLANT
CANISTER SUCTION 2500CC (MISCELLANEOUS) ×3 IMPLANT
CELLS DAT CNTRL 66122 CELL SVR (MISCELLANEOUS) IMPLANT
CLIP APPLIE 5 13 M/L LIGAMAX5 (MISCELLANEOUS) IMPLANT
CLIP APPLIE ROT 10 11.4 M/L (STAPLE) IMPLANT
COUNTER NEEDLE 20 DBL MAG RED (NEEDLE) ×3 IMPLANT
COVER MAYO STAND STRL (DRAPES) ×6 IMPLANT
DECANTER SPIKE VIAL GLASS SM (MISCELLANEOUS) ×1 IMPLANT
DISSECTOR BLUNT TIP ENDO 5MM (MISCELLANEOUS) ×2 IMPLANT
DRAIN CHANNEL 19F RND (DRAIN) ×2 IMPLANT
DRAPE LAPAROSCOPIC ABDOMINAL (DRAPES) ×3 IMPLANT
DRAPE LG THREE QUARTER DISP (DRAPES) ×5 IMPLANT
DRAPE UTILITY XL STRL (DRAPES) ×6 IMPLANT
DRAPE WARM FLUID 44X44 (DRAPE) ×3 IMPLANT
DRSG OPSITE POSTOP 4X10 (GAUZE/BANDAGES/DRESSINGS) IMPLANT
DRSG OPSITE POSTOP 4X6 (GAUZE/BANDAGES/DRESSINGS) IMPLANT
DRSG OPSITE POSTOP 4X8 (GAUZE/BANDAGES/DRESSINGS) IMPLANT
ELECT PENCIL ROCKER SW 15FT (MISCELLANEOUS) ×4 IMPLANT
ELECT REM PT RETURN 9FT ADLT (ELECTROSURGICAL) ×3
ELECTRODE REM PT RTRN 9FT ADLT (ELECTROSURGICAL) ×1 IMPLANT
EVACUATOR SILICONE 100CC (DRAIN) ×2 IMPLANT
FILTER SMOKE EVAC LAPAROSHD (FILTER) IMPLANT
GLOVE BIO SURGEON STRL SZ7.5 (GLOVE) ×8 IMPLANT
GLOVE BIOGEL M STRL SZ7.5 (GLOVE) ×6 IMPLANT
GLOVE ECLIPSE 8.0 STRL XLNG CF (GLOVE) ×10 IMPLANT
GLOVE INDICATOR 8.0 STRL GRN (GLOVE) ×8 IMPLANT
GLOVE SS BIOGEL STRL SZ 7.5 (GLOVE) IMPLANT
GLOVE SUPERSENSE BIOGEL SZ 7.5 (GLOVE) ×10
GLOVE SURG SS PI 7.0 STRL IVOR (GLOVE) ×12 IMPLANT
GLOVE SURG SS PI 7.5 STRL IVOR (GLOVE) ×4 IMPLANT
GOWN STRL REUS W/TWL XL LVL3 (GOWN DISPOSABLE) ×24 IMPLANT
KIT BASIN OR (CUSTOM PROCEDURE TRAY) ×3 IMPLANT
LEGGING LITHOTOMY PAIR STRL (DRAPES) ×3 IMPLANT
LIGASURE IMPACT 36 18CM CVD LR (INSTRUMENTS) ×2 IMPLANT
MANIFOLD NEPTUNE II (INSTRUMENTS) ×4 IMPLANT
PENCIL BUTTON HOLSTER BLD 10FT (ELECTRODE) ×2 IMPLANT
RELOAD PROXIMATE 75MM BLUE (ENDOMECHANICALS) ×3 IMPLANT
RELOAD STAPLE 75 3.8 BLU REG (ENDOMECHANICALS) IMPLANT
RETRACTOR WND ALEXIS 18 MED (MISCELLANEOUS) IMPLANT
RTRCTR WOUND ALEXIS 18CM MED (MISCELLANEOUS)
SCALPEL HARMONIC ACE (MISCELLANEOUS) IMPLANT
SCISSORS LAP 5X35 DISP (ENDOMECHANICALS) ×3 IMPLANT
SET IRRIG TUBING LAPAROSCOPIC (IRRIGATION / IRRIGATOR) ×2 IMPLANT
SLEEVE XCEL OPT CAN 5 100 (ENDOMECHANICALS) ×10 IMPLANT
SOLUTION ANTI FOG 6CC (MISCELLANEOUS) ×5 IMPLANT
SPONGE GAUZE 4X4 12PLY (GAUZE/BANDAGES/DRESSINGS) ×3 IMPLANT
SPONGE LAP 18X18 X RAY DECT (DISPOSABLE) ×6 IMPLANT
STAPLER CIRC CVD 29MM 37CM (STAPLE) ×2 IMPLANT
STAPLER PROXIMATE 75MM BLUE (STAPLE) ×2 IMPLANT
STAPLER VISISTAT 35W (STAPLE) ×3 IMPLANT
SUCTION POOLE TIP (SUCTIONS) ×3 IMPLANT
SUT ETHILON 2 0 PS N (SUTURE) IMPLANT
SUT ETHILON 3 0 PS 1 (SUTURE) ×2 IMPLANT
SUT PDS AB 1 CTX 36 (SUTURE) IMPLANT
SUT PDS AB 1 TP1 96 (SUTURE) IMPLANT
SUT PROLENE 2 0 KS (SUTURE) IMPLANT
SUT PROLENE 2 0 SH DA (SUTURE) ×2 IMPLANT
SUT SILK 2 0 (SUTURE) ×3
SUT SILK 2 0 SH CR/8 (SUTURE) ×3 IMPLANT
SUT SILK 2-0 18XBRD TIE 12 (SUTURE) ×1 IMPLANT
SUT SILK 3 0 (SUTURE) ×3
SUT SILK 3 0 SH CR/8 (SUTURE) ×7 IMPLANT
SUT SILK 3-0 18XBRD TIE 12 (SUTURE) ×1 IMPLANT
SUT VICRYL 2 0 18  UND BR (SUTURE) ×2
SUT VICRYL 2 0 18 UND BR (SUTURE) ×1 IMPLANT
SYR BULB IRRIGATION 50ML (SYRINGE) ×3 IMPLANT
SYS LAPSCP GELPORT 120MM (MISCELLANEOUS) ×3
SYSTEM LAPSCP GELPORT 120MM (MISCELLANEOUS) IMPLANT
TOWEL OR 17X26 10 PK STRL BLUE (TOWEL DISPOSABLE) ×6 IMPLANT
TOWEL OR NON WOVEN STRL DISP B (DISPOSABLE) ×6 IMPLANT
TRAY FOLEY CATH 14FRSI W/METER (CATHETERS) ×3 IMPLANT
TRAY LAP CHOLE (CUSTOM PROCEDURE TRAY) ×3 IMPLANT
TROCAR BLADELESS OPT 5 100 (ENDOMECHANICALS) ×3 IMPLANT
TROCAR XCEL BLUNT TIP 100MML (ENDOMECHANICALS) IMPLANT
TROCAR XCEL NON-BLD 11X100MML (ENDOMECHANICALS) IMPLANT
TUBING INSUFFLATION 10FT LAP (TUBING) ×3 IMPLANT
UNDERPAD 30X30 INCONTINENT (UNDERPADS AND DIAPERS) ×2 IMPLANT
YANKAUER SUCT BULB TIP 10FT TU (MISCELLANEOUS) ×6 IMPLANT

## 2013-06-26 NOTE — Anesthesia Postprocedure Evaluation (Signed)
  Anesthesia Post-op Note  Patient: Timothy Medina  Procedure(s) Performed: Procedure(s) (LRB): LAPAROSCOPIC ASSISTED PARTIAL COLECTOMY (N/A)  Patient Location: PACU  Anesthesia Type: General  Level of Consciousness: awake and alert   Airway and Oxygen Therapy: Patient Spontanous Breathing  Post-op Pain: mild  Post-op Assessment: Post-op Vital signs reviewed, Patient's Cardiovascular Status Stable, Respiratory Function Stable, Patent Airway and No signs of Nausea or vomiting  Last Vitals:  Filed Vitals:   06/26/13 1115  BP: 123/76  Pulse: 59  Temp:   Resp: 13    Post-op Vital Signs: stable   Complications: No apparent anesthesia complications

## 2013-06-26 NOTE — Op Note (Signed)
Operative Note  Timothy Medina male 53 y.o. 06/26/2013  PREOPERATIVE DX:  Recurrent sigmoid diverticulitis  POSTOPERATIVE DX:  Same  PROCEDURE:  Laparoscopic assisted sigmoid colectomy with mobilization of splenic flexure         Surgeon: Odis Hollingshead   Assistants: Greer Pickerel M.D.  Anesthesia: General endotracheal anesthesia  Indications: This is a 53 year old male who said recurrent sigmoid diverticulitis for many years. The episodes are becoming more frequent. He now presents for the above procedure.    Procedure Detail:  He was seen in the holding area. He was brought to the operating room placed supine on the operating table and general anesthetic was given. He was placed in the lithotomy position. The hair on the abdominal wall was clipped. The abdominal wall and perineal areas were sterilely prepped and draped. A timeout was performed.  A 5 mm incision was made in the left subcostal area. He was placed in slight reverse Trendelenburg. Using a 5 mm Optiview trocar a laparoscope access was gained into the peritoneal cavity. A pneumoperitoneum was created. Inspection of the area beneath a trocar demonstrated no evidence of organ injury or bleeding.  A 5 mm trocar was placed in the supraumbilical region. A 5 mm trocar was placed in the right lower quadrant. A 5 mm trocar was placed in the suprapubic area. Later in the case, a 5 mm trocar was placed in the left lower quadrant.  The sigmoid colon was visualized. There is a firm chronically inflamed segment adherent to the lateral abdominal wall. Proximal to this was normal appearing colon. I began mobilizing the left colon by dividing its lateral attachments up to the level of the splenic flexure. I then mobilize the splenic flexure with sharp and blunt dissection. Bleeding was controlled with electrocautery. I dissected the distal transverse colon free from its omental attachments. This allowed for mobilization of the splenic  flexure.  I then identified the left ureter and keep my planned dissection proximal to it in the pelvis. I carefully mobilized the firm inflammatory process free from the lateral sidewall. I then mobilize the rectosigmoid junction using blunt and sharp dissection.  Following this, I removed the suprapubic trocar. A small midline incision was made at this point through the skin, subcutaneous tissue, fascia, and peritoneum. A wound protection device was placed in the incision. I exteriorized the sigmoid colon entered the firm inflammatory segment. I was able to identify the rectosigmoid junction. Using the linear cutting stapler I divided the colon just distal to the rectosigmoid junction. I then divided the mesentery close to the colon. I chose an area at the descending colon sigmoid colon junction and divided this as well. The distal aspect of the specimen was marked with a suture and was passed off the field.  I then approximated the rectum the descending colon and/or there was some tension. I placed a GelPort on the wound protection device and reinsufflated the abdomen. Using hand assist I dissected the omentum from the transverse colon allowing for greater mobilization. Following this, I was able to approximate the descending colon stump the rectal stump without tension.  The staple line was removed the descending colon stump and I placed a size 29 EEA anvil into the lumen of the descending colon. I sutured the colotomy with the linear cutting stapler. I brought the anvil out the side of the colon and secured it with a pursestring suture. The handle of the EEA stapler was then introduced through the anus and brought up  so it was anterior to the staple line. I then performed a side to descending colon to anterior wall rectal anastomosis using the 29 EEA stapler. 2 complete donuts were noted. The colon proximal to the anastomosis was occluded and a leak test was performed. This is done using the  proctosigmoidoscope. No evidence of leak was noted.  Subsequently I removed move the wound protection device. The abdominal cavity was then copiously irrigated out with saline solution. I removed the left lower quadrant trocar and put a 19 Blake drain into this site and positioned it just adjacent to the anastomosis and into the pelvis. It was anchored to the skin with 3-0 nylon suture.  Hemostasis was adequate at this time. The anastomosis was under no tension and was viable, pink, and paint. I closed the peritoneum with running 2-0 Vicryl suture. The fascia of the lower midline was incision was closed with running double looped #1 PDS suture.  Repeat laparoscopy was performed. A four-quadrant and central inspection were performed. There is no evidence of bleeding or organ injury. The fascial closure was solid. The pneumoperitoneum was released and the trocars were removed.  The trocar sites skin incisions were closed with 4-0 Monocryl subcuticular stitches followed by Steri-Strips and sterile dressings. A lower midline incision was irrigated and the skin closure staples. The drain was hooked up to bulb suction. Sterile dressings were applied.  He tolerated the procedure well without any apparent complications and was taken to the room in satisfactory condition.   Estimated Blood Loss:  400 cc         Drains: #19 Blake drain   Blood Given: none          Specimens: Sigmoid colon        Complications:  * No complications entered in OR log *         Disposition: PACU - hemodynamically stable.         Condition: stable

## 2013-06-26 NOTE — Interval H&P Note (Signed)
History and Physical Interval Note:  06/26/2013 7:09 AM  Timothy Medina  has presented today for surgery, with the diagnosis of recurrent diverticulitis  The various methods of treatment have been discussed with the patient and family. After consideration of risks, benefits and other options for treatment, the patient has consented to  Procedure(s): LAPAROSCOPIC ASSISTED PARTIAL COLECTOMY (N/A) as a surgical intervention .  The patient's history has been reviewed, patient examined, no change in status, stable for surgery.  I have reviewed the patient's chart and labs.  Questions were answered to the patient's satisfaction.     Rhunette Croft Cederic Mozley

## 2013-06-26 NOTE — H&P (View-Only) (Signed)
Patient ID: Timothy Medina, male   DOB: [birth year], 53 y.o.   MRN: [id number]  Chief Complaint  Patient presents with  . Diverticulitis    new pt    HPI Timothy Medina is a 53 y.o. male.   HPI  He is well known to our practice. He has had recurring bouts of sigmoid diverticulitis. A partial colectomy has been recommended in the past and that he did not want to have that done at that time. He was hospitalized earlier this year for another bout of diverticulitis. He's been on suppressive antibiotics since then. Colonoscopy demonstrated an inflamed area in the sigmoid colon consistent with the CT scan results. Dr. Laural Golden has recommended he reconsider the surgery and he is here but because of that.  He currently is pain and symptom free.  Past Medical History  Diagnosis Date  . Insomnia   . GERD (gastroesophageal reflux disease)   . Diverticulitis   . Interstitial cystitis   . Personal history of colonic polyps 03/01/2005    adenomatous   . External hemorrhoids without mention of complication   . Family history of malignant neoplasm of gastrointestinal tract     Past Surgical History  Procedure Laterality Date  . Colonoscopy    . Colonoscopy N/A 05/17/2013    Procedure: COLONOSCOPY;  Surgeon: Rogene Houston, MD;  Location: [facility];  Service: Endoscopy;  Laterality: N/A;  1200    Family History  Problem Relation Age of Onset  . Hypertension Mother   . Diabetes Neg Hx   . Heart disease Neg Hx   . Esophageal cancer Neg Hx   . Stomach cancer Neg Hx   . Colon cancer Other     several fam members-pat side  . Alzheimer's disease Other     several fam member   . Prostate cancer Other     ?cousin    Social History History  Substance Use Topics  . Smoking status: Never Smoker   . Smokeless tobacco: Never Used  . Alcohol Use: No    Allergies  Allergen Reactions  . Meperidine Hcl     REACTION: needed Benadryl for hives    Current Outpatient Prescriptions  Medication  Sig Dispense Refill  . amoxicillin-clavulanate (AUGMENTIN) 875-125 MG per tablet Take 1 tablet by mouth 2 (two) times daily.  30 tablet  0  . diphenhydrAMINE (SOMINEX) 25 MG tablet Take 50 mg by mouth at bedtime as needed for sleep.       No current facility-administered medications for this visit.    Review of Systems Review of Systems  Constitutional: Negative.   Respiratory: Negative.   Cardiovascular: Negative.   Gastrointestinal: Negative.   Genitourinary: Negative.   Neurological: Negative.   Hematological: Negative.     Blood pressure 124/72, pulse 75, temperature 97.2 F (36.2 C), temperature source Oral, resp. rate 16, height 5\' 7"  (1.702 m), weight 153 lb (69.4 kg).  Physical Exam Physical Exam  Constitutional: He appears well-developed and well-nourished. No distress.  HENT:  Head: Normocephalic and atraumatic.  Cardiovascular: Normal rate and regular rhythm.   Pulmonary/Chest: Effort normal and breath sounds normal.  Abdominal: Soft. He exhibits no distension and no mass. There is no tenderness.  Musculoskeletal: He exhibits no edema.  Lymphadenopathy:    He has no cervical adenopathy.  Neurological: He is alert.  Skin: Skin is warm and dry.  Psychiatric: He has a normal mood and affect. His behavior is normal.    Data Reviewed  Notes in EPIC.  Colonoscopy report.  Assessment    Recurrent sigmoid diverticulitis. Currently asymptomatic on suppressive therapy. I recommended a laparoscopic assisted partial colectomy. He is in agreement with this.     Plan    Laparoscopic-assisted partial colectomy.  I have explained the procedure and risks of colon resection.  Risks include but are not limited to bleeding, infection, wound problems, anesthesia, anastomotic leak, need for colostomy, injury to intraabominal organs (such as intestine, spleen, kidney, bladder, ureter, etc.), ileus, irregular bowel habits.  He seems to understand and agrees to proceed.         Channin Agustin J 05/28/2013, 12:04 PM

## 2013-06-26 NOTE — Transfer of Care (Signed)
Immediate Anesthesia Transfer of Care Note  Patient: Timothy Medina  Procedure(s) Performed: Procedure(s): LAPAROSCOPIC ASSISTED PARTIAL COLECTOMY (N/A)  Patient Location: PACU  Anesthesia Type:General  Level of Consciousness: awake, alert , oriented and patient cooperative  Airway & Oxygen Therapy: Patient Spontanous Breathing and Patient connected to face mask oxygen  Post-op Assessment: Report given to PACU RN, Post -op Vital signs reviewed and stable and Patient moving all extremities X 4  Post vital signs: stable  Complications: No apparent anesthesia complications

## 2013-06-27 LAB — CBC
HEMATOCRIT: 35.6 % — AB (ref 39.0–52.0)
HEMOGLOBIN: 11.8 g/dL — AB (ref 13.0–17.0)
MCH: 28.9 pg (ref 26.0–34.0)
MCHC: 33.1 g/dL (ref 30.0–36.0)
MCV: 87.3 fL (ref 78.0–100.0)
Platelets: 143 10*3/uL — ABNORMAL LOW (ref 150–400)
RBC: 4.08 MIL/uL — AB (ref 4.22–5.81)
RDW: 13.4 % (ref 11.5–15.5)
WBC: 11.8 10*3/uL — AB (ref 4.0–10.5)

## 2013-06-27 LAB — BASIC METABOLIC PANEL
BUN: 17 mg/dL (ref 6–23)
CHLORIDE: 100 meq/L (ref 96–112)
CO2: 26 meq/L (ref 19–32)
Calcium: 8.2 mg/dL — ABNORMAL LOW (ref 8.4–10.5)
Creatinine, Ser: 0.86 mg/dL (ref 0.50–1.35)
GFR calc Af Amer: >90 mL/min (ref >90)
GFR calc non Af Amer: >90 mL/min (ref >90)
Glucose, Bld: 137 mg/dL — ABNORMAL HIGH (ref 70–99)
Potassium: 4.3 mEq/L (ref 3.7–5.3)
SODIUM: 134 meq/L — AB (ref 137–147)

## 2013-06-27 MED ORDER — FAMOTIDINE IN NACL 20-0.9 MG/50ML-% IV SOLN
20.0000 mg | Freq: Two times a day (BID) | INTRAVENOUS | Status: DC
  Filled 2013-06-27: qty 50

## 2013-06-27 MED ORDER — FAMOTIDINE IN NACL 20-0.9 MG/50ML-% IV SOLN
20.0000 mg | Freq: Two times a day (BID) | INTRAVENOUS | Status: AC
  Administered 2013-06-27: 20 mg via INTRAVENOUS
  Filled 2013-06-27: qty 50

## 2013-06-27 NOTE — Care Management Note (Signed)
    Page 1 of 1   06/27/2013     10:11:48 AM CARE MANAGEMENT NOTE 06/27/2013  Patient:  Timothy Medina, Timothy Medina   Account Number:  [id number]  Date Initiated:  06/27/2013  Documentation initiated by:  Sunday Spillers  Subjective/Objective Assessment:   53 yo male admitted s/p lap assisted partial colectomy. PTA lived at home alone.     Action/Plan:   Home when stable   Anticipated DC Date:  06/27/2013   Anticipated DC Plan:  [place]  CM consult      Choice offered to / List presented to:             Status of service:  Completed, signed off Medicare Important Message given?  NA - LOS <3 / Initial given by admissions (If response is "NO", the following Medicare IM given date fields will be blank) Date Medicare IM given:   Date Additional Medicare IM given:    Discharge Disposition:  HOME/SELF CARE  Per UR Regulation:  Reviewed for med. necessity/level of care/duration of stay  If discussed at [place] of Stay Meetings, dates discussed:    Comments:

## 2013-06-27 NOTE — Progress Notes (Signed)
1 Day Post-Op  Subjective: Pain well controlled.  No nausea.  Has been OOB  Objective: Vital signs in last 24 hours: Temp:  [97.4 F (36.3 C)-98.9 F (37.2 C)] 98 F (36.7 C) (05/06 0620) Pulse Rate:  [56-78] 71 (05/06 0620) Resp:  [7-16] 14 (05/06 0620) BP: (100-135)/(62-89) 100/62 mmHg (05/06 0620) SpO2:  [96 %-100 %] 96 % (05/06 0620) Weight:  [156 lb 3.2 oz (70.852 kg)] 156 lb 3.2 oz (70.852 kg) (05/05 2100)    Intake/Output from previous day: 05/05 0701 - 05/06 0700 In: 6130 [I.V.:5500] Out: 2025 [Urine:1755; Drains:170; Blood:100] Intake/Output this shift:    PE: General- In NAD Abdomen-soft, some bloody drainage on lower abdominal dressing  Lab Results:   Recent Labs  06/27/13 0405  WBC 11.8*  HGB 11.8*  HCT 35.6*  PLT 143*   BMET  Recent Labs  06/27/13 0405  NA 134*  K 4.3  CL 100  CO2 26  GLUCOSE 137*  BUN 17  CREATININE 0.86  CALCIUM 8.2*   PT/INR No results found for this basename: LABPROT, INR,  in the last 72 hours Comprehensive Metabolic Panel:    Component Value Date/Time   NA 134* 06/27/2013 0405   NA 138 06/13/2013 1050   K 4.3 06/27/2013 0405   K 4.6 06/13/2013 1050   CL 100 06/27/2013 0405   CL 104 06/13/2013 1050   CO2 26 06/27/2013 0405   CO2 24 06/13/2013 1050   BUN 17 06/27/2013 0405   BUN 17 06/13/2013 1050   CREATININE 0.86 06/27/2013 0405   CREATININE 1.01 06/13/2013 1050   GLUCOSE 137* 06/27/2013 0405   GLUCOSE 96 06/13/2013 1050   CALCIUM 8.2* 06/27/2013 0405   CALCIUM 9.4 06/13/2013 1050   AST 20 06/13/2013 1050   AST 14 03/16/2013 0745   ALT 25 06/13/2013 1050   ALT 22 03/16/2013 0745   ALKPHOS 60 06/13/2013 1050   ALKPHOS 74 03/16/2013 0745   BILITOT 0.4 06/13/2013 1050   BILITOT 0.8 03/16/2013 0745   PROT 7.1 06/13/2013 1050   PROT 7.5 03/16/2013 0745   ALBUMIN 3.9 06/13/2013 1050   ALBUMIN 3.5 03/16/2013 0745     Studies/Results: No results found.  Anti-infectives: Anti-infectives   Start     Dose/Rate Route Frequency Ordered  Stop   06/26/13 2000  cefoTEtan (CEFOTAN) 2 g in dextrose 5 % 50 mL IVPB     2 g 100 mL/hr over 30 Minutes Intravenous Every 12 hours 06/26/13 1314 06/26/13 2131   06/26/13 0547  cefoTEtan (CEFOTAN) 2 g in dextrose 5 % 50 mL IVPB     2 g 100 mL/hr over 30 Minutes Intravenous On call to O.R. 06/26/13 0547 06/26/13 0740      Assessment Principal Problem:   Recurrent diverticulitis of sigmoid colon s/p lap assisted partial colectomy 06/26/13-stable overnight; surgery discussed with him.    LOS: 1 day   Plan: Clear liquids.  Incentive spirometry.  Remove foley tomorrow.   Rhunette Croft Susana Duell 06/27/2013

## 2013-06-28 ENCOUNTER — Encounter (HOSPITAL_COMMUNITY): Payer: Self-pay | Admitting: General Surgery

## 2013-06-28 MED ORDER — MORPHINE SULFATE 2 MG/ML IJ SOLN: 1.0000 mg | INTRAMUSCULAR | Status: DC | PRN

## 2013-06-28 MED ORDER — KETOROLAC TROMETHAMINE 15 MG/ML IJ SOLN
15.0000 mg | Freq: Four times a day (QID) | INTRAMUSCULAR | Status: AC
  Administered 2013-06-28 – 2013-06-29 (×4): 15 mg via INTRAVENOUS
  Filled 2013-06-28 (×7): qty 1

## 2013-06-28 NOTE — Progress Notes (Signed)
2 Days Post-Op  Subjective: Tolerating clear liquids.  Voiding okay.  Pain under control.  Would like to get rid of PCA.  No flatus or BM.  No N/V   Objective: Vital signs in last 24 hours: Temp:  [97.7 F (36.5 C)-98.4 F (36.9 C)] 97.9 F (36.6 C) (05/07 0615) Pulse Rate:  [61-66] 66 (05/07 0615) Resp:  [12-17] 16 (05/07 0733) BP: (109-117)/(66-73) 113/73 mmHg (05/07 0615) SpO2:  [96 %-99 %] 96 % (05/07 0733) FiO2 (%):  [38 %-99 %] 38 % (05/07 0224)    Intake/Output from previous day: 05/06 0701 - 05/07 0700 In: 3791.3 [P.O.:1320; I.V.:2421.3; IV Piggyback:50] Out: 6269 [Urine:5075; Drains:65] Intake/Output this shift:    PE: General- In NAD Abdomen-soft, some dried bloody drainage on lower abdominal dressing, thin serosanguinous drain output  Lab Results:   Recent Labs  06/27/13 0405  WBC 11.8*  HGB 11.8*  HCT 35.6*  PLT 143*   BMET  Recent Labs  06/27/13 0405  NA 134*  K 4.3  CL 100  CO2 26  GLUCOSE 137*  BUN 17  CREATININE 0.86  CALCIUM 8.2*   PT/INR No results found for this basename: LABPROT, INR,  in the last 72 hours Comprehensive Metabolic Panel:    Component Value Date/Time   NA 134* 06/27/2013 0405   NA 138 06/13/2013 1050   K 4.3 06/27/2013 0405   K 4.6 06/13/2013 1050   CL 100 06/27/2013 0405   CL 104 06/13/2013 1050   CO2 26 06/27/2013 0405   CO2 24 06/13/2013 1050   BUN 17 06/27/2013 0405   BUN 17 06/13/2013 1050   CREATININE 0.86 06/27/2013 0405   CREATININE 1.01 06/13/2013 1050   GLUCOSE 137* 06/27/2013 0405   GLUCOSE 96 06/13/2013 1050   CALCIUM 8.2* 06/27/2013 0405   CALCIUM 9.4 06/13/2013 1050   AST 20 06/13/2013 1050   AST 14 03/16/2013 0745   ALT 25 06/13/2013 1050   ALT 22 03/16/2013 0745   ALKPHOS 60 06/13/2013 1050   ALKPHOS 74 03/16/2013 0745   BILITOT 0.4 06/13/2013 1050   BILITOT 0.8 03/16/2013 0745   PROT 7.1 06/13/2013 1050   PROT 7.5 03/16/2013 0745   ALBUMIN 3.9 06/13/2013 1050   ALBUMIN 3.5 03/16/2013 0745     Studies/Results: No  results found.  Anti-infectives: Anti-infectives   Start     Dose/Rate Route Frequency Ordered Stop   06/26/13 2000  cefoTEtan (CEFOTAN) 2 g in dextrose 5 % 50 mL IVPB     2 g 100 mL/hr over 30 Minutes Intravenous Every 12 hours 06/26/13 1314 06/26/13 2131   06/26/13 0547  cefoTEtan (CEFOTAN) 2 g in dextrose 5 % 50 mL IVPB     2 g 100 mL/hr over 30 Minutes Intravenous On call to O.R. 06/26/13 0547 06/26/13 0740      Assessment Principal Problem:   Recurrent diverticulitis of sigmoid colon s/p lap assisted partial colectomy 06/26/13-progressing well.   LOS: 2 days   Plan: Stop PCA.  Toradol.  Advance diet.   Timothy Medina 06/28/2013

## 2013-06-29 MED ORDER — OXYCODONE HCL 5 MG PO TABS
5.0000 mg | ORAL_TABLET | ORAL | Status: DC | PRN
  Administered 2013-06-29 – 2013-06-30 (×3): 10 mg via ORAL
  Administered 2013-06-30: 5 mg via ORAL
  Filled 2013-06-29 (×4): qty 2

## 2013-06-29 MED ORDER — OXYCODONE HCL 5 MG PO TABS: 5.0000 mg | ORAL_TABLET | ORAL | Status: DC | PRN

## 2013-06-29 NOTE — Discharge Instructions (Signed)
[facility], [place] [phone]  OPEN ABDOMINAL SURGERY: POST OP INSTRUCTIONS  Always review your discharge instruction sheet given to you by the facility where your surgery was performed.  IF YOU HAVE DISABILITY OR FAMILY LEAVE FORMS, YOU MUST BRING THEM TO THE OFFICE FOR PROCESSING.  PLEASE DO NOT GIVE THEM TO YOUR DOCTOR.  1. A prescription for pain medication may be given to you upon discharge.  Take your pain medication as prescribed, if needed.  If narcotic pain medicine is not needed, then you may take acetaminophen (Tylenol) or ibuprofen (Advil) as needed. 2. Take your usually prescribed medications unless otherwise directed. 3. If you need a refill on your pain medication, please contact your pharmacy. They will contact our office to request authorization.  Prescriptions will not be filled after 5pm or on week-ends. 4. You should follow a bland diet. 5. Most patients will experience some swelling and bruising in the area of the incision. Ice pack will help. Swelling and bruising can take several days to resolve..  6. It is common to experience some constipation if taking pain medication after surgery.  Increasing fluid intake and taking a stool softener will usually help or prevent this problem from occurring.  A mild laxative (Milk of Magnesia or Miralax) should be taken according to package directions if there are no bowel movements after 48 hours. 7.  You may have steri-strips (small skin tapes) in place directly over the incision.  These strips should be left on the skin for 7-10 days.  If your surgeon used skin glue on the incision, you may shower in 24 hours.  The glue will flake off over the next 2-3 weeks.  Any sutures or staples will be removed at the office during your follow-up visit. You may find that a light gauze bandage over your incision may keep your staples from being rubbed or pulled. You may shower and replace the bandage daily. 8. ACTIVITIES:  You may  resume regular (light) daily activities beginning the next day--such as daily self-care, walking, climbing stairs--gradually increasing activities as tolerated.  You may have sexual intercourse when it is comfortable.  Refrain from any heavy lifting or straining for 6 weeks a. You may drive when you no longer are taking prescription pain medication, you can comfortably wear a seatbelt, and you can safely maneuver your car and apply brakes b. Return to Work: ___________________________________ 85. You should see your doctor in the office for a follow-up appointment approximately two weeks after your surgery.  Make sure that you call for this appointment within a day or two after you arrive home to insure a convenient appointment time. OTHER INSTRUCTIONS:  __Apply a dry bandage to lower wound and drain site daily.  May shower.___________________________________________________________ _____________________________________________________________  WHEN TO CALL YOUR DOCTOR: 1. Fever over 101.5 2. Inability to urinate 3. Nausea and/or vomiting 4. Extreme swelling or bruising 5. Continued bleeding from incision. 6. Increased pain, redness, or drainage from the incision.  The clinic staff is available to answer your questions during regular business hours.  Please dont hesitate to call and ask to speak to one of the nurses if you have concerns.  For further questions, please visit [email or web address]

## 2013-06-29 NOTE — Progress Notes (Signed)
Patient reported to this nurse that he had a medium sized bowel movement, the first once since his surgery.  He had already flushed it, but stated that it was a mixture of both bright red and dark colored blood.  He denies light-headedness or feeling faint.  He is encouraged to let this nurse know if the blood becomes more bright red or he begins experiencing dizziness or feeling faint.

## 2013-06-29 NOTE — Progress Notes (Signed)
3 Days Post-Op  Subjective: Tolerating diet.  No BM or flatus.  Objective: Vital signs in last 24 hours: Temp:  [98.2 F (36.8 C)-99.1 F (37.3 C)] 98.2 F (36.8 C) (05/08 0545) Pulse Rate:  [56-81] 56 (05/08 0545) Resp:  [18] 18 (05/08 0545) BP: (110-136)/(62-83) 136/83 mmHg (05/08 0545) SpO2:  [97 %-100 %] 97 % (05/08 0545)    Intake/Output from previous day: 05/07 0701 - 05/08 0700 In: 1879.2 [I.V.:1879.2] Out: 1305 [Urine:1250; Drains:55] Intake/Output this shift:    PE: General- In NAD Abdomen-soft, incisions are clean and intact, thin serosanguinous drain output  Lab Results:   Recent Labs  06/27/13 0405  WBC 11.8*  HGB 11.8*  HCT 35.6*  PLT 143*   BMET  Recent Labs  06/27/13 0405  NA 134*  K 4.3  CL 100  CO2 26  GLUCOSE 137*  BUN 17  CREATININE 0.86  CALCIUM 8.2*   PT/INR No results found for this basename: LABPROT, INR,  in the last 72 hours Comprehensive Metabolic Panel:    Component Value Date/Time   NA 134* 06/27/2013 0405   NA 138 06/13/2013 1050   K 4.3 06/27/2013 0405   K 4.6 06/13/2013 1050   CL 100 06/27/2013 0405   CL 104 06/13/2013 1050   CO2 26 06/27/2013 0405   CO2 24 06/13/2013 1050   BUN 17 06/27/2013 0405   BUN 17 06/13/2013 1050   CREATININE 0.86 06/27/2013 0405   CREATININE 1.01 06/13/2013 1050   GLUCOSE 137* 06/27/2013 0405   GLUCOSE 96 06/13/2013 1050   CALCIUM 8.2* 06/27/2013 0405   CALCIUM 9.4 06/13/2013 1050   AST 20 06/13/2013 1050   AST 14 03/16/2013 0745   ALT 25 06/13/2013 1050   ALT 22 03/16/2013 0745   ALKPHOS 60 06/13/2013 1050   ALKPHOS 74 03/16/2013 0745   BILITOT 0.4 06/13/2013 1050   BILITOT 0.8 03/16/2013 0745   PROT 7.1 06/13/2013 1050   PROT 7.5 03/16/2013 0745   ALBUMIN 3.9 06/13/2013 1050   ALBUMIN 3.5 03/16/2013 0745     Studies/Results:  Colon, segmental resection, sigmoid - ACUTE DIVERTICULITIS AND DIVERTICULOSIS. - NO ATYPIA OR MALIGNANCY. - RESECTION MARGINS VIABLE Result discussed with  him.  Anti-infectives: Anti-infectives   Start     Dose/Rate Route Frequency Ordered Stop   06/26/13 2000  cefoTEtan (CEFOTAN) 2 g in dextrose 5 % 50 mL IVPB     2 g 100 mL/hr over 30 Minutes Intravenous Every 12 hours 06/26/13 1314 06/26/13 2131   06/26/13 0547  cefoTEtan (CEFOTAN) 2 g in dextrose 5 % 50 mL IVPB     2 g 100 mL/hr over 30 Minutes Intravenous On call to O.R. 06/26/13 0547 06/26/13 0740      Assessment Principal Problem:   Recurrent diverticulitis of sigmoid colon s/p lap assisted partial colectomy 06/26/13-has a mild ileus   LOS: 3 days   Plan: Try oral analgesic.  Wait for return of bowel function.   Rhunette Croft Charan Prieto 06/29/2013

## 2013-06-30 MED ORDER — OXYCODONE-ACETAMINOPHEN 5-325 MG PO TABS: 1.0000 | ORAL_TABLET | ORAL | Status: DC | PRN

## 2013-06-30 MED ORDER — ONDANSETRON 4 MG PO TBDP: 4.0000 mg | ORAL_TABLET | Freq: Three times a day (TID) | ORAL | Status: DC | PRN

## 2013-06-30 NOTE — Discharge Summary (Signed)
Physician Discharge Summary  Patient ID: Timothy Medina MRN: [id number] DOB/AGE: April 19, 1960 53 y.o.  Admit date: 06/26/2013 Discharge date: 06/30/2013  Admission Diagnoses:  Recurrent diverticulitis  Discharge Diagnoses:  same  Principal Problem:   Recurrent diverticulitis of sigmoid colon s/p lap assisted partial colectomy 06/26/13   Surgery: lap assisted partial colectomy  Discharged Condition: improved3  Hospital Course:   Had surgery.  Taken to floor where he had an uneventful recovery.    Consults: none  Significant Diagnostic Studies: none    Discharge Exam: Blood pressure 121/84, pulse 90, temperature 98 F (36.7 C), temperature source Oral, resp. rate 18, height 5\' 7"  (1.702 m), weight 156 lb 3.2 oz (70.852 kg), SpO2 98.00%. Incisions are bland.  JP to be removed at discharge.   Disposition: 01-Home or Self Care  Discharge Orders   Future Appointments Provider Department Dept Phone   07/10/2013 2:00 PM Odis Hollingshead, MD [facility], [place] [phone]   Future Orders Complete By Expires   Call MD for:  persistant nausea and vomiting  As directed    Call MD for:  redness, tenderness, or signs of infection (pain, swelling, redness, odor or green/yellow discharge around incision site)  As directed    Diet - low sodium heart healthy  As directed    Discharge instructions  As directed    Increase activity slowly  As directed    No dressing needed  As directed        Medication List    STOP taking these medications       amoxicillin-clavulanate 875-125 MG per tablet  Commonly known as:  AUGMENTIN      TAKE these medications       diphenhydrAMINE 25 MG tablet  Commonly known as:  SOMINEX  Take 50 mg by mouth at bedtime as needed for sleep.     ondansetron 4 MG disintegrating tablet  Commonly known as:  ZOFRAN ODT  Take 1 tablet (4 mg total) by mouth every 8 (eight) hours as needed for nausea or vomiting.     oxyCODONE 5 MG immediate release  tablet  Commonly known as:  Oxy IR/ROXICODONE  Take 1-2 tablets (5-10 mg total) by mouth every 4 (four) hours as needed for moderate pain.     oxyCODONE-acetaminophen 5-325 MG per tablet  Commonly known as:  ROXICET  Take 1 tablet by mouth every 4 (four) hours as needed for severe pain.     PROBIOTIC DAILY PO  Take 1 capsule by mouth daily.           Follow-up Information   Follow up with Odis Hollingshead, MD.   Specialty:  General Surgery   Contact information:   [address] [place] [address] [phone]       Signed: Pedro Earls 06/30/2013, 9:06 AM

## 2013-06-30 NOTE — Plan of Care (Signed)
Problem: Discharge Progression Outcomes Goal: Staples/sutures removed Outcome: Not Met (add Reason) To be done in MD office on post op recheck

## 2013-07-04 ENCOUNTER — Ambulatory Visit (INDEPENDENT_AMBULATORY_CARE_PROVIDER_SITE_OTHER): Payer: BC Managed Care – PPO | Admitting: *Deleted

## 2013-07-04 DIAGNOSIS — K5732 Diverticulitis of large intestine without perforation or abscess without bleeding: Secondary | ICD-10-CM

## 2013-07-04 NOTE — Progress Notes (Signed)
Pt came in today for staple removal .  Wound looked great.  No redness, swelling.  Removed staples and placed steri-strips over the wound and advised pt and his wife that they should work their self out in about a week or so.  Pt did ask what to do if he ran out of pain medication, I advised pt to give [place] a call a day ahead that there was a protocol on pain medication process and pt understands and agrees.  Anderson Malta

## 2013-07-06 ENCOUNTER — Telehealth (INDEPENDENT_AMBULATORY_CARE_PROVIDER_SITE_OTHER): Payer: Self-pay

## 2013-07-06 NOTE — Telephone Encounter (Signed)
Pt s/p lap partial colectomy.  Pt would like a refill on his Oxycodone 5/325 mg.

## 2013-07-06 NOTE — Telephone Encounter (Signed)
Ok to refill 

## 2013-07-09 ENCOUNTER — Other Ambulatory Visit (INDEPENDENT_AMBULATORY_CARE_PROVIDER_SITE_OTHER): Payer: Self-pay

## 2013-07-09 MED ORDER — OXYCODONE-ACETAMINOPHEN 5-325 MG PO TABS: 1.0000 | ORAL_TABLET | ORAL | Status: DC | PRN

## 2013-07-09 NOTE — Telephone Encounter (Signed)
Called pt to verify he received his Oxycodone 5/325 refill.  He says he picked it up on 07/06/13.  Refill added to pt's medication list.

## 2013-07-10 ENCOUNTER — Encounter (INDEPENDENT_AMBULATORY_CARE_PROVIDER_SITE_OTHER): Payer: Self-pay | Admitting: General Surgery

## 2013-07-10 ENCOUNTER — Encounter (INDEPENDENT_AMBULATORY_CARE_PROVIDER_SITE_OTHER): Payer: BC Managed Care – PPO | Admitting: General Surgery

## 2013-07-10 ENCOUNTER — Ambulatory Visit (INDEPENDENT_AMBULATORY_CARE_PROVIDER_SITE_OTHER): Payer: BC Managed Care – PPO | Admitting: General Surgery

## 2013-07-10 VITALS — BP 136/82 | HR 107 | Temp 98.5°F | Resp 16 | Ht 67.0 in | Wt 145.0 lb

## 2013-07-10 DIAGNOSIS — Z4889 Encounter for other specified surgical aftercare: Secondary | ICD-10-CM

## 2013-07-10 NOTE — Progress Notes (Signed)
Procedure:  Laparoscopic-assisted sigmoid colectomy  Date:  06/26/2013  Pathology:  Acute diverticulitis and diverticulosis  History:  He is here for his first postoperative visit and is doing well. Bowels are working. Tolerating his diet. Having some incisional soreness.  Exam: General- Is in NAD. Abdomen-soft, incisions clean and intact, some slight irritation from clothing and lower midline incision. Band-Aid applied.  Assessment:  Progressing well postoperatively.  Plan:  Continue light activities. Increase fiber in diet. Applied Band-Aid to irritated area of incision daily. Return visit 4 weeks.

## 2013-07-10 NOTE — Patient Instructions (Signed)
Continue light activities. Start increasing the fiber in your diet. Stay well hydrated.

## 2013-07-11 ENCOUNTER — Telehealth (INDEPENDENT_AMBULATORY_CARE_PROVIDER_SITE_OTHER): Payer: Self-pay

## 2013-07-11 NOTE — Telephone Encounter (Signed)
Pt called in concerned about lack of appetite. Pt states that he saw Dr. Zella Richer on yesterday and was told that all was well.  I advised pt that its normal to have lack of appetite PO and that he should continue his bland diet and maybe supplement with Ensure(meal replacement) to accommodate nutritional needs. Pt voiced understanding and was reassured.

## 2013-07-23 ENCOUNTER — Encounter (INDEPENDENT_AMBULATORY_CARE_PROVIDER_SITE_OTHER): Payer: Self-pay

## 2013-08-13 ENCOUNTER — Ambulatory Visit (INDEPENDENT_AMBULATORY_CARE_PROVIDER_SITE_OTHER): Payer: BC Managed Care – PPO | Admitting: General Surgery

## 2013-08-13 ENCOUNTER — Encounter (INDEPENDENT_AMBULATORY_CARE_PROVIDER_SITE_OTHER): Payer: Self-pay | Admitting: General Surgery

## 2013-08-13 VITALS — BP 136/80 | HR 65 | Temp 98.0°F | Resp 18 | Ht 67.0 in | Wt 154.0 lb

## 2013-08-13 DIAGNOSIS — Z4889 Encounter for other specified surgical aftercare: Secondary | ICD-10-CM

## 2013-08-13 NOTE — Progress Notes (Signed)
Procedure:  Laparoscopic-assisted sigmoid colectomy  Date:  06/26/2013  Pathology:  Acute diverticulitis and diverticulosis  History:  He is here for his second postoperative visit.  He is feeling much better overall. He is eating well. Bowels are moving. He wants to know when he can start exercising again. Exam: General- Is in NAD. Abdomen-soft, Limited lower midline incision has an exposed suture which was removed. Small Band-Aid applied to area. Other wounds are clean and intact. No evidence of infection. Assessment:  Doing well postoperatively.  Plan:  May start resuming normal activities. Apply Band-Aid to small area in the wound until it is healed. High fiber diet. Return visit as needed.

## 2013-08-13 NOTE — Patient Instructions (Signed)
May resume normal activities and exercise as long as there is no discomfort or no pain. High fiber diet. Change bandage daily until small wound has completely healed.

## 2013-09-25 ENCOUNTER — Telehealth (INDEPENDENT_AMBULATORY_CARE_PROVIDER_SITE_OTHER): Payer: Self-pay

## 2013-09-25 NOTE — Telephone Encounter (Signed)
Pt is requesting family leave approval for his second, full time job.  He was released from Dr. Bertrum Sol care on 08/13/13.  His surgery was in March 2015.  I explained to the patient that this would not be granted since he had been released from care.

## 2013-10-22 ENCOUNTER — Encounter: Payer: Self-pay | Admitting: Internal Medicine

## 2013-10-22 ENCOUNTER — Ambulatory Visit (INDEPENDENT_AMBULATORY_CARE_PROVIDER_SITE_OTHER): Payer: BC Managed Care – PPO | Admitting: Internal Medicine

## 2013-10-22 VITALS — BP 122/78 | HR 73 | Temp 98.3°F | Wt 156.3 lb

## 2013-10-22 DIAGNOSIS — K5732 Diverticulitis of large intestine without perforation or abscess without bleeding: Secondary | ICD-10-CM

## 2013-10-22 NOTE — Assessment & Plan Note (Addendum)
Recuperating well from a partial colectomy, recommend to start taking Metamucil. Needs FMLA papers, in case he gets sick again,  has used all his vacation-sick time He will bring the paperwork to me. Otherwise followup by 11-2013 for a cpx

## 2013-10-22 NOTE — Progress Notes (Signed)
Pre visit review using our clinic review tool, if applicable. No additional management support is needed unless otherwise documented below in the visit note. 

## 2013-10-22 NOTE — Patient Instructions (Signed)
Please bring your FMLA paper, will fill them  Next visit by 11-2013

## 2013-10-22 NOTE — Progress Notes (Signed)
   Subjective:    Patient ID: Timothy Medina, male    DOB: 12/20/1960, 53 y.o.   MRN: [id number]  DOS:  10/22/2013 Type of visit - description : Routine visit Interval history: Since the last time he was here, had diverticulitis and eventually a partial colectomy. Recovering  well. See assessment and plan.   ROS Denies fever chills. Appetite is back to normal. No  nausea, vomiting, diarrhea or blood in the stools.  Past Medical History  Diagnosis Date  . Insomnia   . GERD (gastroesophageal reflux disease)   . Diverticulitis   . Interstitial cystitis   . Personal history of colonic polyps 03/01/2005    adenomatous   . External hemorrhoids without mention of complication   . Family history of malignant neoplasm of gastrointestinal tract     Past Surgical History  Procedure Laterality Date  . Colonoscopy    . Colonoscopy N/A 05/17/2013    Procedure: COLONOSCOPY;  Surgeon: Rogene Houston, MD;  Location: [facility]ITE;  Service: Endoscopy;  Laterality: N/A;  1200  . Cystoscopy    . Laparoscopic partial colectomy N/A 06/26/2013    Procedure: LAPAROSCOPIC ASSISTED PARTIAL COLECTOMY;  Surgeon: Odis Hollingshead, MD;  Location: [facility];  Service: General;  Laterality: N/A;    History   Social History  . Marital Status: Divorced    Spouse Name: N/A    Number of Children: 0  . Years of Education: N/A   Occupational History  . teacher at [employer] History Main Topics  . Smoking status: Never Smoker   . Smokeless tobacco: Never Used  . Alcohol Use: No  . Drug Use: No  . Sexual Activity: Not on file   Other Topics Concern  . Not on file   Social History Narrative   Has a g-friend        Medication List       This list is accurate as of: 10/22/13  7:47 PM.  Always use your most recent med list.               diphenhydrAMINE 25 MG tablet  Commonly known as:  SOMINEX  Take 50 mg by mouth at bedtime as needed for sleep.     PROBIOTIC DAILY PO  Take  1 capsule by mouth daily.           Objective:   Physical Exam BP 122/78  Pulse 73  Temp(Src) 98.3 F (36.8 C) (Oral)  Wt 156 lb 4.9 oz (70.9 kg)  SpO2 97%  General -- alert, well-developed, NAD.    HEENT-- Not pale. Lungs -- normal respiratory effort, no intercostal retractions, no accessory muscle use, and normal breath sounds.  Heart-- normal rate, regular rhythm, no murmur.  Abdomen-- Not distended, good bowel sounds,soft, non-tender. No rebound or rigidity. No mass,organomegaly. Extremities-- no pretibial edema bilaterally  Neurologic--  alert & oriented X3. Speech normal, gait appropriate for age, strength symmetric and appropriate for age.    Psych-- Cognition and judgment appear intact. Cooperative with normal attention span and concentration. No anxious or depressed appearing.       Assessment & Plan:

## 2013-11-01 ENCOUNTER — Telehealth: Payer: Self-pay

## 2013-11-01 NOTE — Telephone Encounter (Signed)
Informed Pt that FMLA paperwork is ready for pick up at the front desk.

## 2014-01-03 ENCOUNTER — Encounter: Payer: Self-pay | Admitting: Internal Medicine

## 2014-01-03 ENCOUNTER — Ambulatory Visit (INDEPENDENT_AMBULATORY_CARE_PROVIDER_SITE_OTHER): Payer: BC Managed Care – PPO | Admitting: Internal Medicine

## 2014-01-03 ENCOUNTER — Ambulatory Visit (INDEPENDENT_AMBULATORY_CARE_PROVIDER_SITE_OTHER): Payer: BC Managed Care – PPO

## 2014-01-03 VITALS — BP 113/77 | HR 63 | Temp 97.6°F | Ht 67.0 in | Wt 155.2 lb

## 2014-01-03 DIAGNOSIS — Z23 Encounter for immunization: Secondary | ICD-10-CM

## 2014-01-03 DIAGNOSIS — Z0000 Encounter for general adult medical examination without abnormal findings: Secondary | ICD-10-CM

## 2014-01-03 LAB — BASIC METABOLIC PANEL
BUN: 21 mg/dL (ref 6–23)
CHLORIDE: 105 meq/L (ref 96–112)
CO2: 25 mEq/L (ref 19–32)
Calcium: 9.4 mg/dL (ref 8.4–10.5)
Creatinine, Ser: 1 mg/dL (ref 0.4–1.5)
GFR: 85.42 mL/min (ref >60.00)
Glucose, Bld: 91 mg/dL (ref 70–99)
POTASSIUM: 4.5 meq/L (ref 3.5–5.1)
SODIUM: 137 meq/L (ref 135–145)

## 2014-01-03 LAB — LIPID PANEL
CHOLESTEROL: 203 mg/dL — AB (ref 0–200)
HDL: 35.8 mg/dL — AB (ref >39.00)
LDL Cholesterol: 158 mg/dL — ABNORMAL HIGH (ref 0–99)
NonHDL: 167.2
Total CHOL/HDL Ratio: 6
Triglycerides: 48 mg/dL (ref 0.0–149.0)
VLDL: 9.6 mg/dL (ref 0.0–40.0)

## 2014-01-03 LAB — CBC WITH DIFFERENTIAL/PLATELET
Basophils Absolute: 0 10*3/uL (ref 0.0–0.1)
Basophils Relative: 0.6 % (ref 0.0–3.0)
EOS PCT: 2 % (ref 0.0–5.0)
Eosinophils Absolute: 0.1 10*3/uL (ref 0.0–0.7)
HEMATOCRIT: 43.6 % (ref 39.0–52.0)
HEMOGLOBIN: 14.3 g/dL (ref 13.0–17.0)
Lymphocytes Relative: 16.6 % (ref 12.0–46.0)
Lymphs Abs: 0.7 10*3/uL (ref 0.7–4.0)
MCHC: 32.8 g/dL (ref 30.0–36.0)
MCV: 88 fl (ref 78.0–100.0)
MONO ABS: 0.4 10*3/uL (ref 0.1–1.0)
MONOS PCT: 9.5 % (ref 3.0–12.0)
NEUTROS ABS: 3 10*3/uL (ref 1.4–7.7)
Neutrophils Relative %: 71.3 % (ref 43.0–77.0)
Platelets: 156 10*3/uL (ref 150.0–400.0)
RBC: 4.96 Mil/uL (ref 4.22–5.81)
RDW: 13.8 % (ref 11.5–15.5)
WBC: 4.3 10*3/uL (ref 4.0–10.5)

## 2014-01-03 LAB — TSH: TSH: 3.4 u[IU]/mL (ref 0.35–4.50)

## 2014-01-03 LAB — PSA: PSA: 0.86 ng/mL (ref 0.10–4.00)

## 2014-01-03 LAB — HIV ANTIBODY (ROUTINE TESTING W REFLEX): HIV 1&2 Ab, 4th Generation: NONREACTIVE

## 2014-01-03 LAB — RPR

## 2014-01-03 NOTE — Patient Instructions (Signed)
Get your blood work before you leave    Please come back to the office in 1 year  for a physical exam. Come back fasting    

## 2014-01-03 NOTE — Progress Notes (Signed)
Pre visit review using our clinic review tool, if applicable. No additional management support is needed unless otherwise documented below in the visit note. 

## 2014-01-03 NOTE — Assessment & Plan Note (Signed)
Td  08  flu shot  today pnm shot per pt request  H/o several colonoscopies, last 04-2013-- next per GI  Discussed diet and exercise  blood work,  will do an HIV, VDRL   per patient request. RTC 1 year

## 2014-01-03 NOTE — Progress Notes (Signed)
Subjective:    Patient ID: Timothy Medina, male    DOB: 1960-06-06, 53 y.o.   MRN: [id number]  DOS:  01/03/2014 Type of visit - description : cpx Interval history:  had a partial colectomy 06-2013, recuperating well.  ROS No  CP, SOB No palpitations Denies  nausea, vomiting diarrhea, blood in the stools (-) cough, sputum production (-) wheezing, chest congestion  No dysuria, gross hematuria, difficulty urinating ; ++ frequency at baseline No anxiety, depression. No headaches     Past Medical History  Diagnosis Date  . Insomnia   . GERD (gastroesophageal reflux disease)   . Diverticulitis   . Interstitial cystitis   . Personal history of colonic polyps 03/01/2005    adenomatous   . External hemorrhoids without mention of complication   . Family history of malignant neoplasm of gastrointestinal tract     Past Surgical History  Procedure Laterality Date  . Colonoscopy    . Colonoscopy N/A 05/17/2013    Procedure: COLONOSCOPY;  Surgeon: Rogene Houston, MD;  Location: [facility];  Service: Endoscopy;  Laterality: N/A;  1200  . Cystoscopy    . Laparoscopic partial colectomy N/A 06/26/2013    Procedure: LAPAROSCOPIC ASSISTED PARTIAL COLECTOMY;  Surgeon: Odis Hollingshead, MD;  Location: [facility];  Service: General;  Laterality: N/A;    History   Social History  . Marital Status: Divorced    Spouse Name: N/A    Number of Children: 0  . Years of Education: N/A   Occupational History  . teacher at [employer] History Main Topics  . Smoking status: Never Smoker   . Smokeless tobacco: Never Used  . Alcohol Use: No  . Drug Use: No  . Sexual Activity: Not on file   Other Topics Concern  . Not on file   Social History Narrative   Has a g-friend, she is an Therapist, sports     Family History  Problem Relation Age of Onset  . Hypertension Mother   . Diabetes Neg Hx   . Heart disease Neg Hx   . Esophageal cancer Neg Hx   . Stomach cancer Neg Hx   . Colon cancer  Other     several fam members-pat side  . Alzheimer's disease Other     several fam member   . Prostate cancer Other     ?cousin      Medication List       This list is accurate as of: 01/03/14 11:59 PM.  Always use your most recent med list.               diphenhydrAMINE 25 MG tablet  Commonly known as:  SOMINEX  Take 50 mg by mouth at bedtime as needed for sleep.           Objective:   Physical Exam BP 113/77 mmHg  Pulse 63  Temp(Src) 97.6 F (36.4 C) (Oral)  Ht 5\' 7"  (1.702 m)  Wt 155 lb 4 oz (70.421 kg)  BMI 24.31 kg/m2  SpO2 98% General -- alert, well-developed, NAD.  Neck --no thyromegaly   HEENT-- Not pale.  Lungs -- normal respiratory effort, no intercostal retractions, no accessory muscle use, and normal breath sounds.  Heart-- normal rate, regular rhythm, no murmur.  Abdomen-- Not distended, good bowel sounds,soft, non-tender. Extremities-- no pretibial edema bilaterally  Neurologic--  alert & oriented X3. Speech normal, gait appropriate for age, strength symmetric and appropriate for age.  Psych--  Cognition and judgment appear intact. Cooperative with normal attention span and concentration. No anxious or depressed appearing. \    Assessment & Plan:

## 2015-01-03 ENCOUNTER — Encounter: Payer: Self-pay | Admitting: Behavioral Health

## 2015-01-03 ENCOUNTER — Telehealth: Payer: Self-pay | Admitting: Behavioral Health

## 2015-01-03 NOTE — Telephone Encounter (Signed)
Pre-Visit Call completed with patient and chart updated.   Pre-Visit Info documented in Specialty Comments under [employer].    

## 2015-01-06 ENCOUNTER — Encounter: Payer: Self-pay | Admitting: Internal Medicine

## 2015-01-06 ENCOUNTER — Ambulatory Visit (INDEPENDENT_AMBULATORY_CARE_PROVIDER_SITE_OTHER): Payer: 59 | Admitting: Internal Medicine

## 2015-01-06 VITALS — BP 116/78 | HR 53 | Temp 98.1°F | Ht 69.0 in | Wt 173.5 lb

## 2015-01-06 DIAGNOSIS — Z0000 Encounter for general adult medical examination without abnormal findings: Secondary | ICD-10-CM | POA: Diagnosis not present

## 2015-01-06 DIAGNOSIS — Z1159 Encounter for screening for other viral diseases: Secondary | ICD-10-CM

## 2015-01-06 DIAGNOSIS — Z23 Encounter for immunization: Secondary | ICD-10-CM

## 2015-01-06 LAB — CBC WITH DIFFERENTIAL/PLATELET
BASOS ABS: 0 10*3/uL (ref 0.0–0.1)
Basophils Relative: 0.4 % (ref 0.0–3.0)
EOS ABS: 0.1 10*3/uL (ref 0.0–0.7)
Eosinophils Relative: 2.5 % (ref 0.0–5.0)
HCT: 43.7 % (ref 39.0–52.0)
HEMOGLOBIN: 14.7 g/dL (ref 13.0–17.0)
LYMPHS PCT: 23.7 % (ref 12.0–46.0)
Lymphs Abs: 1 10*3/uL (ref 0.7–4.0)
MCHC: 33.7 g/dL (ref 30.0–36.0)
MCV: 87.3 fl (ref 78.0–100.0)
MONOS PCT: 10.8 % (ref 3.0–12.0)
Monocytes Absolute: 0.5 10*3/uL (ref 0.1–1.0)
NEUTROS ABS: 2.6 10*3/uL (ref 1.4–7.7)
Neutrophils Relative %: 62.6 % (ref 43.0–77.0)
PLATELETS: 167 10*3/uL (ref 150.0–400.0)
RBC: 5.01 Mil/uL (ref 4.22–5.81)
RDW: 13.9 % (ref 11.5–15.5)
WBC: 4.2 10*3/uL (ref 4.0–10.5)

## 2015-01-06 LAB — LIPID PANEL
CHOL/HDL RATIO: 6
Cholesterol: 192 mg/dL (ref 0–200)
HDL: 33.6 mg/dL — ABNORMAL LOW (ref >39.00)
LDL Cholesterol: 134 mg/dL — ABNORMAL HIGH (ref 0–99)
NonHDL: 158.22
TRIGLYCERIDES: 119 mg/dL (ref 0.0–149.0)
VLDL: 23.8 mg/dL (ref 0.0–40.0)

## 2015-01-06 LAB — BASIC METABOLIC PANEL
BUN: 21 mg/dL (ref 6–23)
CALCIUM: 9.1 mg/dL (ref 8.4–10.5)
CO2: 28 mEq/L (ref 19–32)
CREATININE: 1.01 mg/dL (ref 0.40–1.50)
Chloride: 105 mEq/L (ref 96–112)
GFR: 82.18 mL/min (ref >60.00)
Glucose, Bld: 94 mg/dL (ref 70–99)
Potassium: 4.6 mEq/L (ref 3.5–5.1)
Sodium: 138 mEq/L (ref 135–145)

## 2015-01-06 LAB — HEPATITIS C ANTIBODY: HCV Ab: NEGATIVE

## 2015-01-06 NOTE — Patient Instructions (Signed)
Get your blood work before you leave   The name of the other pneumonia shot is PREVNAR,   Next visit  for a physical exam in one year, fasting      Please schedule an appointment at the front desk

## 2015-01-06 NOTE — Progress Notes (Signed)
Subjective:    Patient ID: Timothy Medina, male    DOB: [birth year], 54 y.o.   MRN: [id number]  DOS:  01/06/2015 Type of visit - description : CPX Interval history:  No concerns, feeling very well.  Review of Systems Constitutional: No fever. No chills. No unexplained wt changes. No unusual sweats  HEENT: No dental problems, no ear discharge, no facial swelling, no voice changes. No eye discharge, no eye  redness , no  intolerance to light   Respiratory: No wheezing , no  difficulty breathing. No cough , no mucus production  Cardiovascular: No CP, no leg swelling , no  Palpitations  GI: no nausea, no vomiting, no diarrhea , no  abdominal pain.  No blood in the stools. No dysphagia, no odynophagia    Endocrine: No polyphagia, no polyuria , no polydipsia  GU: No dysuria, gross hematuria, difficulty urinating. No urinary urgency, no frequency.  Musculoskeletal: No joint swellings or unusual aches or pains  Skin: No change in the color of the skin, palor , no  Rash  Allergic, immunologic: No environmental allergies , no  food allergies  Neurological: No dizziness no  syncope. No headaches. No diplopia, no slurred, no slurred speech, no motor deficits, no facial  Numbness  Hematological: No enlarged lymph nodes, no easy bruising , no unusual bleedings  Psychiatry: No suicidal ideas, no hallucinations, no beavior problems, no confusion.  No unusual/severe anxiety, no depression   Past Medical History  Diagnosis Date  . Insomnia   . GERD (gastroesophageal reflux disease)   . Diverticulitis   . Interstitial cystitis   . Personal history of colonic polyps 03/01/2005    adenomatous   . External hemorrhoids without mention of complication   . Family history of malignant neoplasm of gastrointestinal tract     Past Surgical History  Procedure Laterality Date  . Colonoscopy    . Colonoscopy N/A 05/17/2013    Procedure: COLONOSCOPY;  Surgeon: Rogene Houston, MD;  [facility]: [facility];  Service: Endoscopy;  Laterality: N/A;  1200  . Cystoscopy    . Laparoscopic partial colectomy N/A 06/26/2013    Procedure: LAPAROSCOPIC ASSISTED PARTIAL COLECTOMY;  Surgeon: Odis Hollingshead, MD;  Location: [facility];  Service: General;  Laterality: N/A;    Social History   Social History  . Marital Status: Divorced    Spouse Name: N/A  . Number of Children: 0  . Years of Education: N/A   Occupational History  . Dealer trucks     Social History Main Topics  . Smoking status: Never Smoker   . Smokeless tobacco: Never Used  . Alcohol Use: No  . Drug Use: No  . Sexual Activity: Not on file   Other Topics Concern  . Not on file   Social History Narrative   Has a g-friend, she is an Therapist, sports     Family History  Problem Relation Age of Onset  . Hypertension Mother   . Diabetes Neg Hx   . Heart disease Neg Hx   . Esophageal cancer Neg Hx   . Stomach cancer Neg Hx   . Colon cancer Other     several fam members-pat side  . Alzheimer's disease Other     several fam member   . Prostate cancer Other     ?cousin       Medication List       This list is accurate as of: 01/06/15 12:47 PM.  Always use your most  recent med list.               diphenhydrAMINE 25 MG tablet  Commonly known as:  SOMINEX  Take 50 mg by mouth at bedtime as needed for sleep.           Objective:   Physical Exam BP 116/78 mmHg  Pulse 53  Temp(Src) 98.1 F (36.7 C) (Oral)  Ht 5\' 9"  (1.753 m)  Wt 173 lb 8 oz (78.699 kg)  BMI 25.61 kg/m2  SpO2 98% General:   Well developed, well nourished . NAD.  HEENT:  Normocephalic . Face symmetric, atraumatic Neck: No thyromegaly Lungs:  CTA B Normal respiratory effort, no intercostal retractions, no accessory muscle use. Heart: RRR,  no murmur.  no pretibial edema bilaterally  Abdomen:  Not distended, soft, non-tender. No rebound or rigidity. No mass,organomegaly Skin: Not pale. Not jaundice Neurologic:  alert & oriented X3.    Speech normal, gait appropriate for age and unassisted Psych--  Cognition and judgment appear intact.  Cooperative with normal attention span and concentration.  Behavior appropriate. No anxious or depressed appearing.    Assessment & Plan:   Assessment> GERD Interstitial cystitis Diverticulitis --> S/p laparoscopic partial colectomy 5-15

## 2015-01-06 NOTE — Assessment & Plan Note (Addendum)
Td  08  flu shot  today pnm shot-2015 Interested in [place]-- asked to check w/ his insurance ($)  H/o several colonoscopies, last 04-2013-- next per GI Prostate cancer screening: Normal DRE 2014, PSAs wnl, reassess next year Discussed diet and exercise  blood work : BMP, CBC, FLP, hep C. RTC 1 year

## 2015-01-06 NOTE — Progress Notes (Signed)
Pre visit review using our clinic review tool, if applicable. No additional management support is needed unless otherwise documented below in the visit note. 

## 2016-01-09 ENCOUNTER — Encounter: Payer: 59 | Admitting: Internal Medicine

## 2016-04-07 ENCOUNTER — Encounter: Payer: Self-pay | Admitting: Internal Medicine

## 2016-04-07 ENCOUNTER — Ambulatory Visit (INDEPENDENT_AMBULATORY_CARE_PROVIDER_SITE_OTHER): Payer: Commercial Managed Care - HMO | Admitting: Internal Medicine

## 2016-04-07 VITALS — BP 120/74 | HR 76 | Temp 97.8°F | Resp 12 | Ht 69.0 in | Wt 163.0 lb

## 2016-04-07 DIAGNOSIS — Z0000 Encounter for general adult medical examination without abnormal findings: Secondary | ICD-10-CM

## 2016-04-07 DIAGNOSIS — Z23 Encounter for immunization: Secondary | ICD-10-CM

## 2016-04-07 LAB — BASIC METABOLIC PANEL
BUN: 21 mg/dL (ref 6–23)
CHLORIDE: 103 meq/L (ref 96–112)
CO2: 31 mEq/L (ref 19–32)
Calcium: 9.4 mg/dL (ref 8.4–10.5)
Creatinine, Ser: 1.1 mg/dL (ref 0.40–1.50)
GFR: 74.12 mL/min (ref >60.00)
Glucose, Bld: 91 mg/dL (ref 70–99)
Potassium: 4.7 mEq/L (ref 3.5–5.1)
SODIUM: 136 meq/L (ref 135–145)

## 2016-04-07 LAB — CBC WITH DIFFERENTIAL/PLATELET
Basophils Absolute: 0 10*3/uL (ref 0.0–0.1)
Basophils Relative: 0.4 % (ref 0.0–3.0)
Eosinophils Absolute: 0.1 10*3/uL (ref 0.0–0.7)
Eosinophils Relative: 2.4 % (ref 0.0–5.0)
HCT: 45.4 % (ref 39.0–52.0)
Hemoglobin: 15 g/dL (ref 13.0–17.0)
Lymphocytes Relative: 19.9 % (ref 12.0–46.0)
Lymphs Abs: 0.9 10*3/uL (ref 0.7–4.0)
MCHC: 33 g/dL (ref 30.0–36.0)
MCV: 88.4 fl (ref 78.0–100.0)
Monocytes Absolute: 0.5 10*3/uL (ref 0.1–1.0)
Monocytes Relative: 11.9 % (ref 3.0–12.0)
Neutro Abs: 3 10*3/uL (ref 1.4–7.7)
Neutrophils Relative %: 65.4 % (ref 43.0–77.0)
Platelets: 179 10*3/uL (ref 150.0–400.0)
RBC: 5.14 Mil/uL (ref 4.22–5.81)
RDW: 13.4 % (ref 11.5–15.5)
WBC: 4.6 10*3/uL (ref 4.0–10.5)

## 2016-04-07 LAB — LIPID PANEL
CHOLESTEROL: 185 mg/dL (ref 0–200)
HDL: 40.6 mg/dL (ref >39.00)
LDL Cholesterol: 130 mg/dL — ABNORMAL HIGH (ref 0–99)
NonHDL: 144.44
TRIGLYCERIDES: 73 mg/dL (ref 0.0–149.0)
Total CHOL/HDL Ratio: 5
VLDL: 14.6 mg/dL (ref 0.0–40.0)

## 2016-04-07 LAB — AST: AST: 21 U/L (ref 0–37)

## 2016-04-07 LAB — ALT: ALT: 28 U/L (ref 0–53)

## 2016-04-07 LAB — PSA: PSA: 1.14 ng/mL (ref 0.10–4.00)

## 2016-04-07 NOTE — Assessment & Plan Note (Signed)
Td 02-2016;  flu shot  today pnm shot-2015  H/o several colonoscopies, last 04-2013-- next per GI Prostate cancer screening: Normal DRE today, check a PSA  Discussed diet and exercise  Labs : BMP, AST, ALT, FLP, CBC, PSA RTC 1 year

## 2016-04-07 NOTE — Patient Instructions (Signed)
GO TO THE LAB : Get the blood work     GO TO THE FRONT DESK Schedule your next appointment for a  physical exam in one year  

## 2016-04-07 NOTE — Progress Notes (Signed)
Pre visit review using our clinic review tool, if applicable. No additional management support is needed unless otherwise documented below in the visit note. 

## 2016-04-07 NOTE — Progress Notes (Signed)
Subjective:    Patient ID: Timothy Medina, male    DOB: [birth year], 56 y.o.   MRN: [id number]  DOS:  04/07/2016 Type of visit - description : CPX Interval history: No major concerns   Review of Systems Constitutional: No fever. No chills. No unexplained wt changes. No unusual sweats  HEENT: No dental problems, no ear discharge, no facial swelling, no voice changes. No eye discharge, no eye  redness , no  intolerance to light   Respiratory: No wheezing , no  difficulty breathing. No cough , no mucus production  Cardiovascular: No CP, no leg swelling , no  Palpitations  GI: no nausea, no vomiting, no diarrhea , no  abdominal pain.  No blood in the stools. No dysphagia, no odynophagia    Endocrine: No polyphagia, no polyuria , no polydipsia  GU: No dysuria, gross hematuria, difficulty urinating. Urinary frequency at baseline  Musculoskeletal: No joint swellings or unusual aches or pains  Skin: No change in the color of the skin, palor , no  Rash  Allergic, immunologic: No environmental allergies , no  food allergies  Neurological: No dizziness no  syncope. No headaches. No diplopia, no slurred, no slurred speech, no motor deficits, no facial  Numbness  Hematological: No enlarged lymph nodes, no easy bruising , no unusual bleedings  Psychiatry: No suicidal ideas, no hallucinations, no beavior problems, no confusion.  No unusual/severe anxiety, no depression   Past Medical History:  Diagnosis Date  . Diverticulitis   . External hemorrhoids without mention of complication   . Family history of malignant neoplasm of gastrointestinal tract   . GERD (gastroesophageal reflux disease)   . Insomnia   . Interstitial cystitis   . Personal history of colonic polyps 03/01/2005   adenomatous     Past Surgical History:  Procedure Laterality Date  . COLONOSCOPY    . COLONOSCOPY N/A 05/17/2013   Procedure: COLONOSCOPY;  Surgeon: Rogene Houston, MD;  Location: [facility];   Service: Endoscopy;  Laterality: N/A;  1200  . CYSTOSCOPY  remotely (late 61s?)   Dr Terance Hart  . LAPAROSCOPIC PARTIAL COLECTOMY N/A 06/26/2013   Procedure: LAPAROSCOPIC ASSISTED PARTIAL COLECTOMY;  Surgeon: Odis Hollingshead, MD;  Location: [facility];  Service: General;  Laterality: N/A;    Social History   Social History  . Marital status: Divorced    Spouse name: N/A  . Number of children: 0  . Years of education: N/A   Occupational History  . Dealer , works locally     Social History Main Topics  . Smoking status: Never Smoker  . Smokeless tobacco: Never Used  . Alcohol use No  . Drug use: No  . Sexual activity: Not on file   Other Topics Concern  . Not on file   Social History Narrative   Lives in [place], has a g-friend, she is an Therapist, sports, [place]      Family History  Problem Relation Age of Onset  . Hypertension Mother   . Colon cancer Other     several fam members-pat side  . Alzheimer's disease Other     several fam member   . Prostate cancer Other     ?cousin  . Diabetes Neg Hx   . Heart disease Neg Hx   . Esophageal cancer Neg Hx   . Stomach cancer Neg Hx      Allergies as of 04/07/2016      Reactions   Meperidine Hcl  REACTION: needed Benadryl for hives      Medication List       Accurate as of 04/07/16 11:59 PM. Always use your most recent med list.          diphenhydrAMINE 25 MG tablet Commonly known as:  SOMINEX Take 50 mg by mouth at bedtime as needed for sleep.          Objective:   Physical Exam BP 120/74 (BP Location: Left Arm, Patient Position: Sitting, Cuff Size: Normal)   Pulse 76   Temp 97.8 F (36.6 C) (Oral)   Resp 12   Ht 5\' 9"  (1.753 m)   Wt 163 lb (73.9 kg)   SpO2 98%   BMI 24.07 kg/m   General:   Well developed, well nourished . NAD.  Neck: No  thyromegaly  HEENT:  Normocephalic . Face symmetric, atraumatic Lungs:  CTA B Normal respiratory effort, no intercostal retractions, no accessory muscle  use. Heart: RRR,  no murmur.  No pretibial edema bilaterally  Abdomen:  Not distended, soft, non-tender. No rebound or rigidity.   Skin: Exposed areas without rash. Not pale. Not jaundice Rectal:  External abnormalities: none. Normal sphincter tone. No rectal masses or tenderness.  Stool brown  Prostate: Prostate gland firm and smooth, no enlargement, nodularity, tenderness, mass, asymmetry or induration.  Neurologic:  alert & oriented X3.  Speech normal, gait appropriate for age and unassisted Strength symmetric and appropriate for age.  Psych: Cognition and judgment appear intact.  Cooperative with normal attention span and concentration.  Behavior appropriate. No anxious or depressed appearing.    Assessment & Plan:  Assessment  GERD Interstitial cystitis Diverticulitis --> S/p laparoscopic partial colectomy 5-15  PLAN Here for a CPX, doing well. Interstitial cystitis: Sx at baseline. RTC one year

## 2016-04-08 DIAGNOSIS — Z09 Encounter for follow-up examination after completed treatment for conditions other than malignant neoplasm: Secondary | ICD-10-CM | POA: Insufficient documentation

## 2016-04-08 NOTE — Assessment & Plan Note (Signed)
Here for a CPX, doing well. Interstitial cystitis: Sx at baseline. RTC one year

## 2017-04-11 ENCOUNTER — Encounter: Payer: Commercial Managed Care - HMO | Admitting: Internal Medicine

## 2017-05-23 ENCOUNTER — Encounter: Payer: Self-pay | Admitting: Internal Medicine

## 2017-05-23 ENCOUNTER — Ambulatory Visit (INDEPENDENT_AMBULATORY_CARE_PROVIDER_SITE_OTHER): Payer: 59 | Admitting: Internal Medicine

## 2017-05-23 ENCOUNTER — Ambulatory Visit (HOSPITAL_BASED_OUTPATIENT_CLINIC_OR_DEPARTMENT_OTHER)
Admission: RE | Admit: 2017-05-23 | Discharge: 2017-05-23 | Disposition: A | Discharge status: Home / Self Care | Payer: 59 | Source: Ambulatory Visit | Attending: Internal Medicine | Admitting: Internal Medicine

## 2017-05-23 VITALS — BP 122/68 | HR 65 | Temp 97.8°F | Resp 14 | Ht 69.0 in | Wt 155.4 lb

## 2017-05-23 DIAGNOSIS — Z114 Encounter for screening for human immunodeficiency virus [HIV]: Secondary | ICD-10-CM

## 2017-05-23 DIAGNOSIS — Z0000 Encounter for general adult medical examination without abnormal findings: Secondary | ICD-10-CM | POA: Diagnosis not present

## 2017-05-23 DIAGNOSIS — Z6822 Body mass index (BMI) 22.0-22.9, adult: Secondary | ICD-10-CM | POA: Insufficient documentation

## 2017-05-23 DIAGNOSIS — R634 Abnormal weight loss: Secondary | ICD-10-CM

## 2017-05-23 NOTE — Progress Notes (Signed)
Subjective:    Patient ID: Timothy Medina, male    DOB: [birth year], 57 y.o.   MRN: [id number]  DOS:  05/23/2017 Type of visit - description : CPX, here with his g- friend Interval history: Feels well  Wt Readings from Last 3 Encounters:  05/23/17 155 lb 6 oz (70.5 kg)  04/07/16 163 lb (73.9 kg)  01/06/15 173 lb 8 oz (78.7 kg)    Review of Systems In general for feels well, she has noted weight loss. He reports that his job is very physical and he works long hours.  His girlfriend who is here reports that he has an excellent appetite,  he eats large and  nutritional meals. He specifically denies fever, chills, night sweats, palpitations No nausea, vomiting, diarrhea or blood in the stools. No cough They have noticed some muscle wasting.  Other than above, a 14 point review of systems is negative      Past Medical History:  Diagnosis Date  . Diverticulitis   . External hemorrhoids without mention of complication   . Family history of malignant neoplasm of gastrointestinal tract   . GERD (gastroesophageal reflux disease)   . Insomnia   . Interstitial cystitis   . Personal history of colonic polyps 03/01/2005   adenomatous     Past Surgical History:  Procedure Laterality Date  . COLONOSCOPY    . COLONOSCOPY N/A 05/17/2013   Procedure: COLONOSCOPY;  Surgeon: Rogene Houston, MD;  Location: [facility];  Service: Endoscopy;  Laterality: N/A;  1200  . CYSTOSCOPY  remotely (late 60s?)   Dr Terance Hart  . LAPAROSCOPIC PARTIAL COLECTOMY N/A 06/26/2013   Procedure: LAPAROSCOPIC ASSISTED PARTIAL COLECTOMY;  Surgeon: Odis Hollingshead, MD;  Location: [facility];  Service: General;  Laterality: N/A;    Social History   Socioeconomic History  . Marital status: Divorced    Spouse name: Not on file  . Number of children: 0  . Years of education: Not on file  . Highest education level: Not on file  Occupational History  . Occupation: Dealer , works locally   Social Needs  .  Financial resource strain: Not on file  . Food insecurity:    Worry: Not on file    Inability: Not on file  . Transportation needs:    Medical: Not on file    Non-medical: Not on file  Tobacco Use  . Smoking status: Never Smoker  . Smokeless tobacco: Never Used  Substance and Sexual Activity  . Alcohol use: No  . Drug use: No  . Sexual activity: Not on file  Lifestyle  . Physical activity:    Days per week: Not on file    Minutes per session: Not on file  . Stress: Not on file  Relationships  . Social connections:    Talks on phone: Not on file    Gets together: Not on file    Attends religious service: Not on file    Active member of club or organization: Not on file    Attends meetings of clubs or organizations: Not on file    Relationship status: Not on file  . Intimate partner violence:    Fear of current or ex partner: Not on file    Emotionally abused: Not on file    Physically abused: Not on file    Forced sexual activity: Not on file  Other Topics Concern  . Not on file  Social History Narrative   Lives in [place],  w/  g-friend, she is an Therapist, sports      Family History  Problem Relation Age of Onset  . Hypertension Mother   . Colon cancer Other        several fam members-pat side  . Alzheimer's disease Other        several fam member   . Prostate cancer Other        ?cousin  . Diabetes Neg Hx   . Heart disease Neg Hx   . Esophageal cancer Neg Hx   . Stomach cancer Neg Hx      Allergies as of 05/23/2017      Reactions   Meperidine Hcl    REACTION: needed Benadryl for hives      Medication List        Accurate as of 05/23/17 11:59 PM. Always use your most recent med list.          diphenhydrAMINE 25 MG tablet Commonly known as:  SOMINEX Take 50 mg by mouth at bedtime as needed for sleep.          Objective:   Physical Exam BP 122/68 (BP Location: Right Arm, Patient Position: Sitting, Cuff Size: Small)   Pulse 65   Temp 97.8 F (36.6 C)  (Oral)   Resp 14   Ht 5\' 9"  (1.753 m)   Wt 155 lb 6 oz (70.5 kg)   SpO2 98%   BMI 22.94 kg/m  General:   Well developed, well nourished . NAD.  Neck: No  thyromegaly  Lymphatic system: No abnormal lymph nodes in the axillary, neck and inguinal area HEENT:  Normocephalic . Face symmetric, atraumatic Lungs:  CTA B Normal respiratory effort, no intercostal retractions, no accessory muscle use. Heart: RRR,  no murmur.  No pretibial edema bilaterally  Abdomen:  Not distended, soft, non-tender. No rebound or rigidity.   Skin: Exposed areas without rash. Not pale. Not jaundice Neurologic:  alert & oriented X3.  Speech normal, gait appropriate for age and unassisted Strength symmetric and appropriate for age.  Psych: Cognition and judgment appear intact.  Cooperative with normal attention span and concentration.  Behavior appropriate. No anxious or depressed appearing.     Assessment & Plan:   Assessment  GERD Interstitial cystitis Diverticulitis --> S/p laparoscopic partial colectomy 5-15  PLAN Here for CPX. Weight loss: In the context of a very physical job, long hours and feeling very well.  The ROS is negative I do not believe his weight loss is pathologic, nevertheless in addition to normal labs we will check a chest x-ray, HIV and testosterone levels due to muscle wasting.  Blood was drawn at ~ 2:56 PM, that means that his testosterone level might not be accurate, he is aware . He  lives 2[place]2 hours away and will have a hard time coming back tomorrow for a morning blood drawn. RTC 1 year although he is now living in [facility], considering see a provider there

## 2017-05-23 NOTE — Patient Instructions (Signed)
GO TO THE LAB : Get the blood work     GO TO THE FRONT DESK Schedule your next appointment for a physical exam in 1 year   STOP BY THE FIRST FLOOR:  get the XR   

## 2017-05-23 NOTE — Assessment & Plan Note (Addendum)
-  Td 02-2016; pnm shot: 2015  - CCS: +FH H/o several colonoscopies, last 04-2011 no polyps, next per GI -Prostate cancer screening: Normal DRE and  PSA 2018 - diet and exercise: doing well  -Labs : CMP, FLP, CBC, TSH, HIV, free and total T, CXR  RTC 1 year

## 2017-05-23 NOTE — Progress Notes (Signed)
Pre visit review using our clinic review tool, if applicable. No additional management support is needed unless otherwise documented below in the visit note. 

## 2017-05-24 LAB — CBC WITH DIFFERENTIAL/PLATELET
BASOS ABS: 0 10*3/uL (ref 0.0–0.1)
BASOS PCT: 0.5 % (ref 0.0–3.0)
EOS ABS: 0.2 10*3/uL (ref 0.0–0.7)
Eosinophils Relative: 4.1 % (ref 0.0–5.0)
HCT: 42 % (ref 39.0–52.0)
Hemoglobin: 14.2 g/dL (ref 13.0–17.0)
LYMPHS ABS: 0.6 10*3/uL — AB (ref 0.7–4.0)
Lymphocytes Relative: 13.7 % (ref 12.0–46.0)
MCHC: 33.9 g/dL (ref 30.0–36.0)
MCV: 88.9 fl (ref 78.0–100.0)
Monocytes Absolute: 0.4 10*3/uL (ref 0.1–1.0)
Monocytes Relative: 7.9 % (ref 3.0–12.0)
NEUTROS ABS: 3.4 10*3/uL (ref 1.4–7.7)
NEUTROS PCT: 73.8 % (ref 43.0–77.0)
PLATELETS: 150 10*3/uL (ref 150.0–400.0)
RBC: 4.73 Mil/uL (ref 4.22–5.81)
RDW: 13.7 % (ref 11.5–15.5)
WBC: 4.7 10*3/uL (ref 4.0–10.5)

## 2017-05-24 LAB — TESTOSTERONE TOTAL,FREE,BIO, MALES
Albumin: 4.2 g/dL (ref 3.6–5.1)
Sex Hormone Binding: 55 nmol/L — ABNORMAL HIGH (ref 10–50)
TESTOSTERONE BIOAVAILABLE: 86.1 ng/dL — AB (ref >=110.0)
Testosterone, Free: 44.7 pg/mL — ABNORMAL LOW (ref 46.0–224.0)
Testosterone: 512 ng/dL (ref 250–827)

## 2017-05-24 LAB — HIV ANTIBODY (ROUTINE TESTING W REFLEX): HIV: NONREACTIVE

## 2017-05-24 LAB — COMPREHENSIVE METABOLIC PANEL
ALT: 24 U/L (ref 0–53)
AST: 20 U/L (ref 0–37)
Albumin: 4 g/dL (ref 3.5–5.2)
Alkaline Phosphatase: 50 U/L (ref 39–117)
BUN: 22 mg/dL (ref 6–23)
CALCIUM: 8.8 mg/dL (ref 8.4–10.5)
CHLORIDE: 106 meq/L (ref 96–112)
CO2: 28 meq/L (ref 19–32)
Creatinine, Ser: 0.91 mg/dL (ref 0.40–1.50)
GFR: 91.86 mL/min (ref >60.00)
Glucose, Bld: 91 mg/dL (ref 70–99)
POTASSIUM: 4.4 meq/L (ref 3.5–5.1)
Sodium: 138 mEq/L (ref 135–145)
Total Bilirubin: 0.3 mg/dL (ref 0.2–1.2)
Total Protein: 6.8 g/dL (ref 6.0–8.3)

## 2017-05-24 LAB — LIPID PANEL
CHOL/HDL RATIO: 4
Cholesterol: 159 mg/dL (ref 0–200)
HDL: 42.8 mg/dL (ref >39.00)
LDL CALC: 107 mg/dL — AB (ref 0–99)
NonHDL: 115.94
TRIGLYCERIDES: 44 mg/dL (ref 0.0–149.0)
VLDL: 8.8 mg/dL (ref 0.0–40.0)

## 2017-05-24 LAB — TSH: TSH: 3.6 u[IU]/mL (ref 0.35–4.50)

## 2017-05-24 NOTE — Assessment & Plan Note (Signed)
Here for CPX. Weight loss: In the context of a very physical job, long hours and feeling very well.  The ROS is negative I do not believe his weight loss is pathologic, nevertheless in addition to normal labs we will check a chest x-ray, HIV and testosterone levels due to muscle wasting.  Blood was drawn at ~ 2:56 PM, that means that his testosterone level might not be accurate, he is aware . He  lives 2[place]/2 hours away and will have a hard time coming back tomorrow for a morning blood drawn. RTC 1 year although he is now living in [facility], considering see a provider there

## 2017-07-14 ENCOUNTER — Telehealth: Payer: Self-pay | Admitting: Internal Medicine

## 2017-07-14 NOTE — Telephone Encounter (Signed)
Copied from [place] [facility] [id number]. Topic: Quick Communication - See Telephone Encounter >> Jul 14, 2017  8:08 AM Aurelio Brash B wrote: CRM for notification. See Telephone encounter for: 07/14/17. PT is asking for lab results

## 2017-07-14 NOTE — Telephone Encounter (Signed)
Notes recorded by Damita Dunnings, [place] on 05/26/2017 at 9:51 AM EDT Results mailed to Pt. ------  Notes recorded by Colon Branch, MD on 05/26/2017 at 9:38 AM EDT Send a letter Marcello Moores, Your testosterone levels are slightly decreased (the total testosterone is normal but the "free" testosterone is slightly low). These tests need to be rechecked in few months early in the morning by either a primary doctor like me or endocrinologist who specializes in hormones. If I can help you with that let me know.  Otherwise the chest x-ray, cholesterol and all the other tests came back excellent!

## 2017-07-14 NOTE — Telephone Encounter (Signed)
Spoke w/ Pt, informed of labs. Pt verbalized understanding.

## 2018-04-18 ENCOUNTER — Encounter (INDEPENDENT_AMBULATORY_CARE_PROVIDER_SITE_OTHER): Payer: Self-pay | Admitting: *Deleted

## 2018-05-26 ENCOUNTER — Encounter: Payer: Self-pay | Admitting: Internal Medicine

## 2018-06-23 ENCOUNTER — Telehealth: Payer: Self-pay

## 2018-06-23 NOTE — Telephone Encounter (Signed)
LMOM informing Pt that bcbs allowing virtual cpe- instructed to call office to reschedule at his convenience.

## 2018-07-17 IMAGING — DX DG CHEST 2V
2 series · 2 of 2 positions shown · non-contrast
Comparison: 02/09/2011

CLINICAL DATA: Shortness of breath, cough and chest pain. Weight
loss.

EXAM:
CHEST - 2 VIEW

[chest pa]
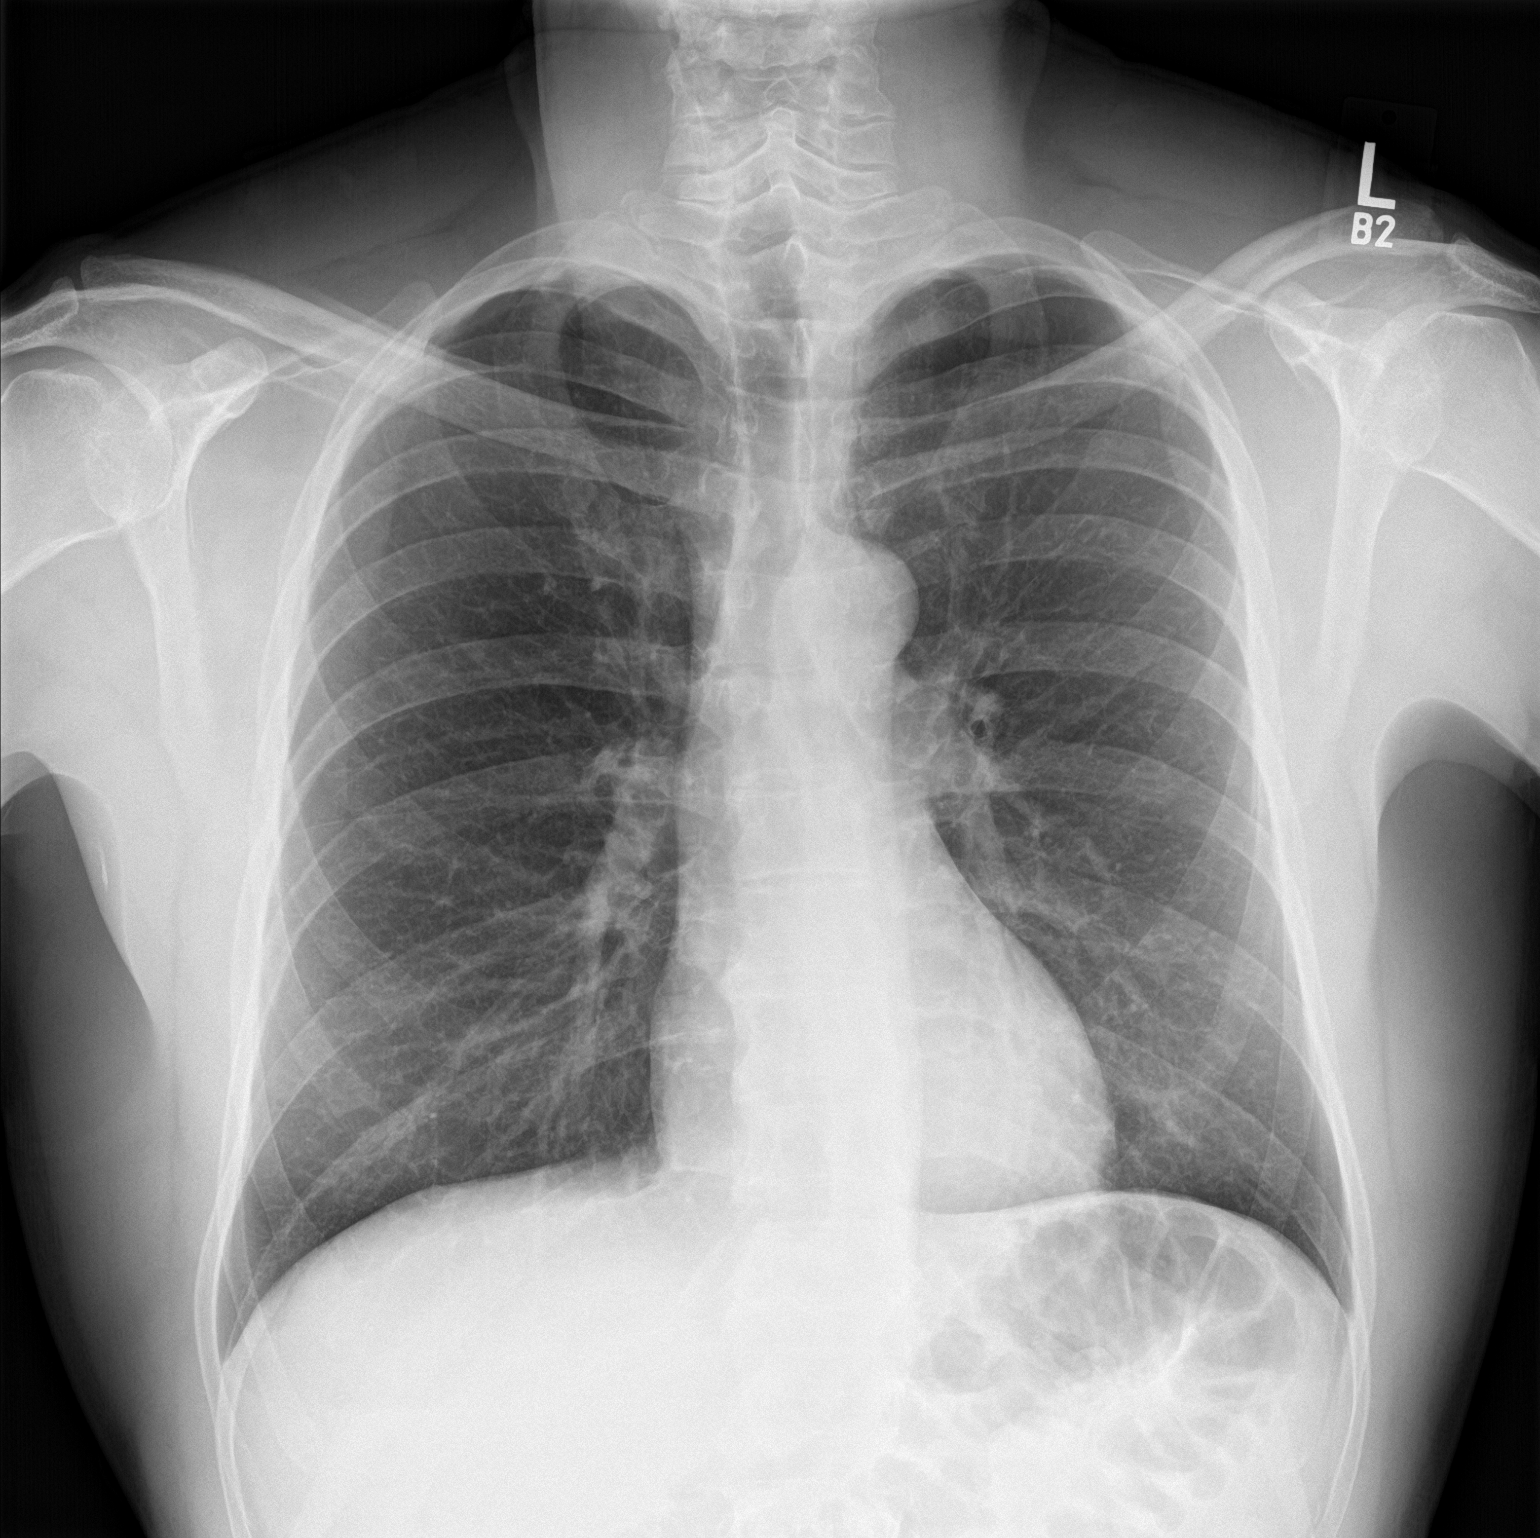

[chest lat]
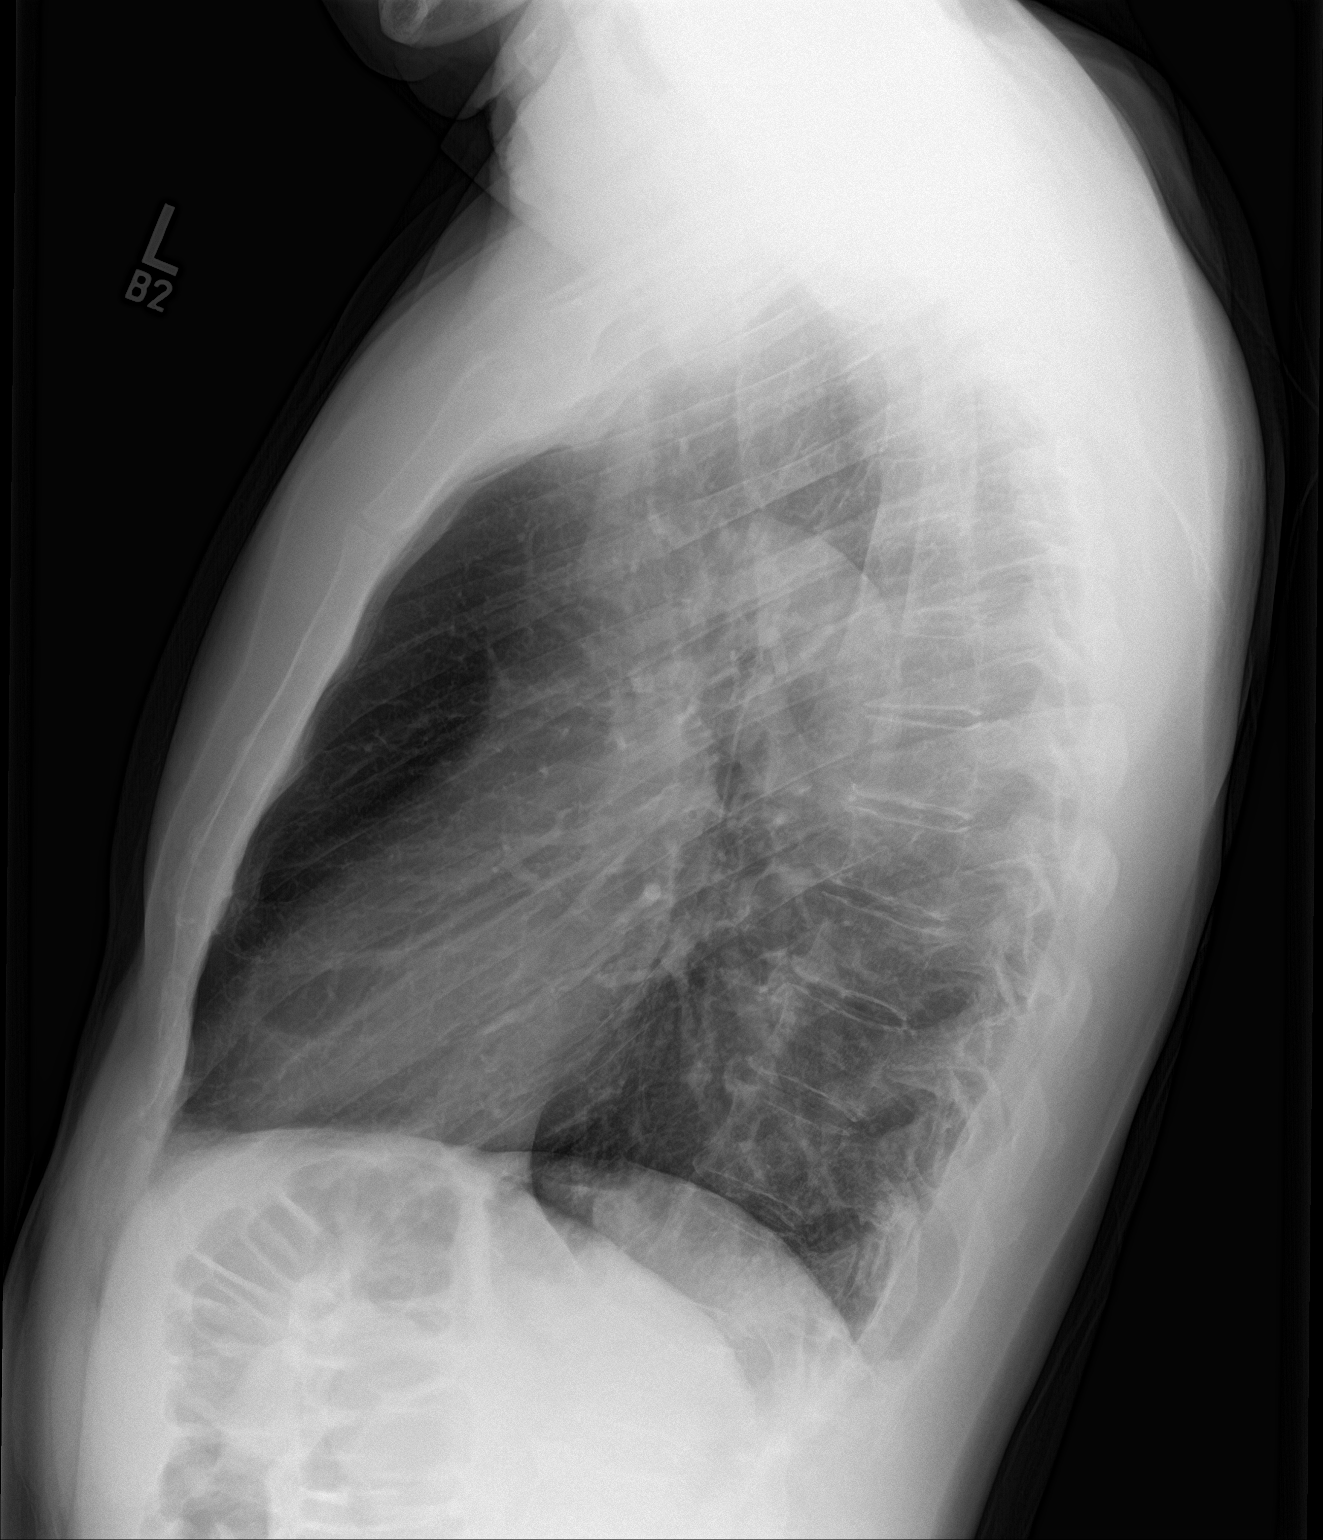

[2 of 2 positions shown; findings below may reference images not displayed]

FINDINGS: Heart size is normal. Mediastinal shadows are normal. The lungs are
clear. No bronchial thickening. No infiltrate, mass, effusion or
collapse. Pulmonary vascularity is normal. No bony abnormality.
IMPRESSION: Normal chest

## 2018-09-18 ENCOUNTER — Encounter: Payer: Self-pay | Admitting: Internal Medicine
